# Patient Record
Sex: Female | Born: 1940 | Race: Asian | Hispanic: No | Marital: Married | Smoking: Never smoker
Health system: Southern US, Community
[De-identification: ages and names within clinical notes are randomized; demographics above are authoritative.]

## PROBLEM LIST (undated history)

## (undated) DIAGNOSIS — R0789 Other chest pain: Secondary | ICD-10-CM

## (undated) DIAGNOSIS — I10 Essential (primary) hypertension: Secondary | ICD-10-CM

## (undated) DIAGNOSIS — E785 Hyperlipidemia, unspecified: Secondary | ICD-10-CM

## (undated) DIAGNOSIS — R002 Palpitations: Secondary | ICD-10-CM

## (undated) HISTORY — DX: Palpitations: R00.2

## (undated) HISTORY — DX: Hyperlipidemia, unspecified: E78.5

## (undated) HISTORY — DX: Essential (primary) hypertension: I10

## (undated) HISTORY — PX: OVARY SURGERY: SHX727

## (undated) HISTORY — DX: Other chest pain: R07.89

---

## 1997-12-23 ENCOUNTER — Ambulatory Visit (HOSPITAL_COMMUNITY): Admission: RE | Admit: 1997-12-23 | Discharge: 1997-12-23 | Payer: Self-pay | Admitting: Gynecology

## 1999-02-18 ENCOUNTER — Ambulatory Visit (HOSPITAL_COMMUNITY): Admission: RE | Admit: 1999-02-18 | Discharge: 1999-02-18 | Payer: Self-pay | Admitting: Gynecology

## 1999-02-18 ENCOUNTER — Encounter: Payer: Self-pay | Admitting: Gynecology

## 1999-03-04 ENCOUNTER — Ambulatory Visit (HOSPITAL_COMMUNITY): Admission: RE | Admit: 1999-03-04 | Discharge: 1999-03-04 | Payer: Self-pay | Admitting: Gynecology

## 1999-03-04 ENCOUNTER — Encounter: Payer: Self-pay | Admitting: Gynecology

## 1999-09-14 ENCOUNTER — Other Ambulatory Visit: Admission: RE | Admit: 1999-09-14 | Discharge: 1999-09-14 | Payer: Self-pay | Admitting: Gynecology

## 2001-01-15 ENCOUNTER — Encounter: Payer: Self-pay | Admitting: *Deleted

## 2001-01-15 ENCOUNTER — Encounter: Admission: RE | Admit: 2001-01-15 | Discharge: 2001-01-15 | Payer: Self-pay | Admitting: *Deleted

## 2001-01-31 ENCOUNTER — Encounter (INDEPENDENT_AMBULATORY_CARE_PROVIDER_SITE_OTHER): Payer: Self-pay | Admitting: *Deleted

## 2001-01-31 ENCOUNTER — Ambulatory Visit (HOSPITAL_COMMUNITY): Admission: RE | Admit: 2001-01-31 | Discharge: 2001-01-31 | Payer: Self-pay | Admitting: *Deleted

## 2002-09-16 ENCOUNTER — Other Ambulatory Visit: Admission: RE | Admit: 2002-09-16 | Discharge: 2002-09-16 | Payer: Self-pay | Admitting: Gynecology

## 2004-12-23 ENCOUNTER — Other Ambulatory Visit: Admission: RE | Admit: 2004-12-23 | Discharge: 2004-12-23 | Payer: Self-pay | Admitting: Gynecology

## 2007-11-22 ENCOUNTER — Encounter: Admission: RE | Admit: 2007-11-22 | Discharge: 2007-11-22 | Payer: Self-pay | Admitting: Family Medicine

## 2012-05-22 ENCOUNTER — Other Ambulatory Visit: Payer: Self-pay | Admitting: Family Medicine

## 2012-05-22 DIAGNOSIS — Z1231 Encounter for screening mammogram for malignant neoplasm of breast: Secondary | ICD-10-CM

## 2012-06-21 ENCOUNTER — Ambulatory Visit
Admission: RE | Admit: 2012-06-21 | Discharge: 2012-06-21 | Disposition: A | Discharge status: Home / Self Care | Payer: Medicare Other | Source: Ambulatory Visit | Attending: Family Medicine | Admitting: Family Medicine

## 2012-06-21 DIAGNOSIS — Z1231 Encounter for screening mammogram for malignant neoplasm of breast: Secondary | ICD-10-CM

## 2013-06-17 ENCOUNTER — Other Ambulatory Visit (HOSPITAL_COMMUNITY): Payer: Self-pay | Admitting: Family Medicine

## 2013-06-17 DIAGNOSIS — Z1231 Encounter for screening mammogram for malignant neoplasm of breast: Secondary | ICD-10-CM

## 2013-07-03 ENCOUNTER — Ambulatory Visit (HOSPITAL_COMMUNITY)
Admission: RE | Admit: 2013-07-03 | Discharge: 2013-07-03 | Disposition: A | Discharge status: Home / Self Care | Payer: Medicare Other | Source: Ambulatory Visit | Attending: Family Medicine | Admitting: Family Medicine

## 2013-07-03 DIAGNOSIS — Z1231 Encounter for screening mammogram for malignant neoplasm of breast: Secondary | ICD-10-CM

## 2014-06-11 ENCOUNTER — Other Ambulatory Visit: Payer: Self-pay | Admitting: Family Medicine

## 2014-06-11 DIAGNOSIS — Z1231 Encounter for screening mammogram for malignant neoplasm of breast: Secondary | ICD-10-CM

## 2014-07-09 ENCOUNTER — Ambulatory Visit (INDEPENDENT_AMBULATORY_CARE_PROVIDER_SITE_OTHER): Payer: Commercial Managed Care - HMO

## 2014-07-09 DIAGNOSIS — Z1231 Encounter for screening mammogram for malignant neoplasm of breast: Secondary | ICD-10-CM

## 2014-12-26 DIAGNOSIS — H35361 Drusen (degenerative) of macula, right eye: Secondary | ICD-10-CM | POA: Diagnosis not present

## 2014-12-26 DIAGNOSIS — H3532 Exudative age-related macular degeneration: Secondary | ICD-10-CM | POA: Diagnosis not present

## 2014-12-26 DIAGNOSIS — H35362 Drusen (degenerative) of macula, left eye: Secondary | ICD-10-CM | POA: Diagnosis not present

## 2015-06-09 DIAGNOSIS — L409 Psoriasis, unspecified: Secondary | ICD-10-CM | POA: Diagnosis not present

## 2015-06-09 DIAGNOSIS — E782 Mixed hyperlipidemia: Secondary | ICD-10-CM | POA: Diagnosis not present

## 2015-06-09 DIAGNOSIS — E559 Vitamin D deficiency, unspecified: Secondary | ICD-10-CM | POA: Diagnosis not present

## 2015-06-09 DIAGNOSIS — Z0000 Encounter for general adult medical examination without abnormal findings: Secondary | ICD-10-CM | POA: Diagnosis not present

## 2015-06-09 DIAGNOSIS — I10 Essential (primary) hypertension: Secondary | ICD-10-CM | POA: Diagnosis not present

## 2015-06-09 DIAGNOSIS — Z23 Encounter for immunization: Secondary | ICD-10-CM | POA: Diagnosis not present

## 2015-06-25 DIAGNOSIS — H353222 Exudative age-related macular degeneration, left eye, with inactive choroidal neovascularization: Secondary | ICD-10-CM | POA: Diagnosis not present

## 2015-06-25 DIAGNOSIS — H353132 Nonexudative age-related macular degeneration, bilateral, intermediate dry stage: Secondary | ICD-10-CM | POA: Diagnosis not present

## 2015-09-01 DIAGNOSIS — R05 Cough: Secondary | ICD-10-CM | POA: Diagnosis not present

## 2015-09-01 DIAGNOSIS — R509 Fever, unspecified: Secondary | ICD-10-CM | POA: Diagnosis not present

## 2015-09-22 DIAGNOSIS — H353131 Nonexudative age-related macular degeneration, bilateral, early dry stage: Secondary | ICD-10-CM | POA: Diagnosis not present

## 2015-12-24 DIAGNOSIS — H353222 Exudative age-related macular degeneration, left eye, with inactive choroidal neovascularization: Secondary | ICD-10-CM | POA: Diagnosis not present

## 2015-12-24 DIAGNOSIS — H353132 Nonexudative age-related macular degeneration, bilateral, intermediate dry stage: Secondary | ICD-10-CM | POA: Diagnosis not present

## 2015-12-24 DIAGNOSIS — H35361 Drusen (degenerative) of macula, right eye: Secondary | ICD-10-CM | POA: Diagnosis not present

## 2015-12-24 DIAGNOSIS — H3570 Unspecified separation of retinal layers: Secondary | ICD-10-CM | POA: Diagnosis not present

## 2016-05-18 DIAGNOSIS — K5730 Diverticulosis of large intestine without perforation or abscess without bleeding: Secondary | ICD-10-CM | POA: Diagnosis not present

## 2016-05-18 DIAGNOSIS — Z1211 Encounter for screening for malignant neoplasm of colon: Secondary | ICD-10-CM | POA: Diagnosis not present

## 2016-06-16 DIAGNOSIS — H353132 Nonexudative age-related macular degeneration, bilateral, intermediate dry stage: Secondary | ICD-10-CM | POA: Diagnosis not present

## 2016-06-16 DIAGNOSIS — H35363 Drusen (degenerative) of macula, bilateral: Secondary | ICD-10-CM | POA: Diagnosis not present

## 2016-06-16 DIAGNOSIS — H35361 Drusen (degenerative) of macula, right eye: Secondary | ICD-10-CM | POA: Diagnosis not present

## 2016-06-16 DIAGNOSIS — H35362 Drusen (degenerative) of macula, left eye: Secondary | ICD-10-CM | POA: Diagnosis not present

## 2016-06-16 DIAGNOSIS — H353222 Exudative age-related macular degeneration, left eye, with inactive choroidal neovascularization: Secondary | ICD-10-CM | POA: Diagnosis not present

## 2016-07-11 DIAGNOSIS — I10 Essential (primary) hypertension: Secondary | ICD-10-CM | POA: Diagnosis not present

## 2016-07-11 DIAGNOSIS — H353290 Exudative age-related macular degeneration, unspecified eye, stage unspecified: Secondary | ICD-10-CM | POA: Diagnosis not present

## 2016-07-11 DIAGNOSIS — Z0000 Encounter for general adult medical examination without abnormal findings: Secondary | ICD-10-CM | POA: Diagnosis not present

## 2016-07-11 DIAGNOSIS — E782 Mixed hyperlipidemia: Secondary | ICD-10-CM | POA: Diagnosis not present

## 2016-07-11 DIAGNOSIS — E559 Vitamin D deficiency, unspecified: Secondary | ICD-10-CM | POA: Diagnosis not present

## 2016-08-23 DIAGNOSIS — B029 Zoster without complications: Secondary | ICD-10-CM | POA: Diagnosis not present

## 2017-02-09 DIAGNOSIS — Z23 Encounter for immunization: Secondary | ICD-10-CM | POA: Diagnosis not present

## 2017-02-09 DIAGNOSIS — Z7189 Other specified counseling: Secondary | ICD-10-CM | POA: Diagnosis not present

## 2017-02-23 DIAGNOSIS — H35362 Drusen (degenerative) of macula, left eye: Secondary | ICD-10-CM | POA: Diagnosis not present

## 2017-02-23 DIAGNOSIS — H35361 Drusen (degenerative) of macula, right eye: Secondary | ICD-10-CM | POA: Diagnosis not present

## 2017-02-23 DIAGNOSIS — H35363 Drusen (degenerative) of macula, bilateral: Secondary | ICD-10-CM | POA: Diagnosis not present

## 2017-02-23 DIAGNOSIS — H353222 Exudative age-related macular degeneration, left eye, with inactive choroidal neovascularization: Secondary | ICD-10-CM | POA: Diagnosis not present

## 2017-02-23 DIAGNOSIS — H353132 Nonexudative age-related macular degeneration, bilateral, intermediate dry stage: Secondary | ICD-10-CM | POA: Diagnosis not present

## 2017-08-11 ENCOUNTER — Other Ambulatory Visit: Payer: Self-pay | Admitting: Family Medicine

## 2017-08-11 DIAGNOSIS — Z0000 Encounter for general adult medical examination without abnormal findings: Secondary | ICD-10-CM | POA: Diagnosis not present

## 2017-08-11 DIAGNOSIS — R7301 Impaired fasting glucose: Secondary | ICD-10-CM | POA: Diagnosis not present

## 2017-08-11 DIAGNOSIS — Z1231 Encounter for screening mammogram for malignant neoplasm of breast: Secondary | ICD-10-CM

## 2017-08-11 DIAGNOSIS — E559 Vitamin D deficiency, unspecified: Secondary | ICD-10-CM | POA: Diagnosis not present

## 2017-08-11 DIAGNOSIS — Z23 Encounter for immunization: Secondary | ICD-10-CM | POA: Diagnosis not present

## 2017-08-11 DIAGNOSIS — I10 Essential (primary) hypertension: Secondary | ICD-10-CM | POA: Diagnosis not present

## 2017-08-11 DIAGNOSIS — E782 Mixed hyperlipidemia: Secondary | ICD-10-CM | POA: Diagnosis not present

## 2017-08-17 ENCOUNTER — Ambulatory Visit (INDEPENDENT_AMBULATORY_CARE_PROVIDER_SITE_OTHER): Payer: Medicare HMO

## 2017-08-17 DIAGNOSIS — Z1231 Encounter for screening mammogram for malignant neoplasm of breast: Secondary | ICD-10-CM

## 2017-10-02 DIAGNOSIS — H353132 Nonexudative age-related macular degeneration, bilateral, intermediate dry stage: Secondary | ICD-10-CM | POA: Diagnosis not present

## 2018-03-13 DIAGNOSIS — H43811 Vitreous degeneration, right eye: Secondary | ICD-10-CM | POA: Diagnosis not present

## 2018-03-13 DIAGNOSIS — H353222 Exudative age-related macular degeneration, left eye, with inactive choroidal neovascularization: Secondary | ICD-10-CM | POA: Diagnosis not present

## 2018-03-13 DIAGNOSIS — H353132 Nonexudative age-related macular degeneration, bilateral, intermediate dry stage: Secondary | ICD-10-CM | POA: Diagnosis not present

## 2018-03-13 DIAGNOSIS — H3570 Unspecified separation of retinal layers: Secondary | ICD-10-CM | POA: Diagnosis not present

## 2018-04-04 DIAGNOSIS — R002 Palpitations: Secondary | ICD-10-CM | POA: Diagnosis not present

## 2018-04-05 ENCOUNTER — Ambulatory Visit (INDEPENDENT_AMBULATORY_CARE_PROVIDER_SITE_OTHER): Payer: Medicare HMO

## 2018-04-05 DIAGNOSIS — I480 Paroxysmal atrial fibrillation: Secondary | ICD-10-CM | POA: Diagnosis not present

## 2018-04-05 DIAGNOSIS — R002 Palpitations: Secondary | ICD-10-CM | POA: Diagnosis not present

## 2018-05-21 ENCOUNTER — Other Ambulatory Visit: Payer: Self-pay | Admitting: Cardiology

## 2018-05-21 DIAGNOSIS — I480 Paroxysmal atrial fibrillation: Secondary | ICD-10-CM

## 2018-06-25 ENCOUNTER — Ambulatory Visit: Payer: Medicare Other | Admitting: Interventional Cardiology

## 2018-06-28 ENCOUNTER — Encounter: Payer: Self-pay | Admitting: *Deleted

## 2018-06-28 NOTE — Progress Notes (Signed)
Cardiology Office Note:    Date:  06/29/2018   ID:  Jasmin Young, DOB Oct 17, 1940, MRN [id number]  PCP:  Daisy Floro, MD  Cardiologist:  Norman Herrlich, MD   Referring MD: Clayborn Heron, MD  ASSESSMENT:    1. Palpitation   2. Essential hypertension   3. Hyperlipidemia, unspecified hyperlipidemia type   4. APC (atrial premature contractions)    PLAN:    In order of problems listed above:  1. Has improved she has symptomatic APCs at this time would not start a beta-blocker or antiarrhythmic drug she will undergo echocardiogram to evaluate for structural heart disease check magnesium and potassium are taking a thiazide diuretic to look for correctable causes of arrhythmia.  She is comfortable with a cautious approach 2. Stable continue her thiazide diuretic 3. Stable continue over-the-counter statin 4. Check magnesium potassium echocardiogram for now hold on antiarrhythmic drugs or beta-blocker suppression  Next appointment 6 weeks   Medication Adjustments/Labs and Tests Ordered: Current medicines are reviewed at length with the patient today.  Concerns regarding medicines are outlined above.  No orders of the defined types were placed in this encounter.  No orders of the defined types were placed in this encounter.    Chief Complaint  Patient presents with  . Palpitations    I had an event monitor    History of Present Illness:    Jasmin Young is a 77 y.o. female who is being seen today for the evaluation of palpitation at the request of Rankins, Fanny Dance, MD. She wore an event monitor in September 2019 which showed symptomatic APC's.  September she had episodes of her heart racing associated with the dizziness and momentary chest tightness symptoms were not exertional and clearly associated with palpitation subsequent worn event monitor the symptoms are improved and shows the presence of frequent APCs but no episodes of atrial fibrillation or flutter no  further chest pain or syncope.  She has no known heart disease however EKG implies right ventricular hypertrophy.  No edema shortness of breath or syncope.  For further evaluation of an echocardiogram and will check lab work including MP and magnesium to exclude electrolyte abnormalities precipitating her arrhythmia.  At this time after discussion we will not start a beta-blocker or antiarrhythmic drug Past Medical History:  Diagnosis Date  . Chest pressure   . Hyperlipidemia   . Hypertension   . Palpitations     Past Surgical History:  Procedure Laterality Date  . CESAREAN SECTION    . OVARY SURGERY      Current Medications: Current Meds  Medication Sig  . amLODipine (NORVASC) 5 MG tablet Take 5 mg by mouth daily.  . Ascorbic Acid (VITAMIN C PO) Take 1 tablet by mouth daily.  Marland Kitchen aspirin EC 81 MG tablet Take 81 mg by mouth daily.  Marland Kitchen b complex vitamins capsule Take 1 capsule by mouth daily.  . calcium carbonate (CALCIUM 600) 600 MG TABS tablet Take 600 mg by mouth daily.  . Cholecalciferol (VITAMIN D-3 PO) Take 2,000 Units by mouth daily.  . hydrochlorothiazide (HYDRODIURIL) 25 MG tablet Take 25 mg by mouth daily.  . Multiple Vitamin (MULTIVITAMIN) tablet Take 1 tablet by mouth daily.  . Multiple Vitamins-Minerals (PRESERVISION AREDS 2 PO) Take 2 capsules by mouth daily.  . Red Yeast Rice 600 MG TABS Take 1 tablet by mouth daily.     Allergies:   Ace inhibitors and Penicillins   Social History   Socioeconomic History  .  Marital status: Married    Spouse name: Not on file  . Number of children: Not on file  . Years of education: Not on file  . Highest education level: Not on file  Occupational History  . Not on file  Social Needs  . Financial resource strain: Not on file  . Food insecurity:    Worry: Not on file    Inability: Not on file  . Transportation needs:    Medical: Not on file    Non-medical: Not on file  Tobacco Use  . Smoking status: Never Smoker  .  Smokeless tobacco: Never Used  Substance and Sexual Activity  . Alcohol use: Never    Frequency: Never  . Drug use: Never  . Sexual activity: Not on file  Lifestyle  . Physical activity:    Days per week: Not on file    Minutes per session: Not on file  . Stress: Not on file  Relationships  . Social connections:    Talks on phone: Not on file    Gets together: Not on file    Attends religious service: Not on file    Active member of club or organization: Not on file    Attends meetings of clubs or organizations: Not on file    Relationship status: Not on file  Other Topics Concern  . Not on file  Social History Narrative  . Not on file     Family History: The patient's family history includes Dementia in her mother; Diabetes in her brother.  ROS:   Review of Systems  Constitution: Negative.  HENT: Negative.   Eyes: Negative.   Cardiovascular: Positive for chest pain and palpitations.  Respiratory: Negative.   Endocrine: Negative.   Hematologic/Lymphatic: Negative.   Skin: Negative.   Musculoskeletal: Positive for back pain.  Gastrointestinal: Negative.   Neurological: Positive for dizziness.  Psychiatric/Behavioral: Negative.    Please see the history of present illness.     All other systems reviewed and are negative.  EKGs/Labs/Other Studies Reviewed:    The following studies were reviewed today: I reviewed her event monitor prior to the visit to see if I can arrange for this study to be assigned to me so I can provide an official interpretation frequent symptomatic episodes associated with APCs  EKG:  EKG is  ordered today.  The ekg ordered today demonstrates [facility] consider RVH  Recent Labs: No results found for requested labs within last 8760 hours.  Recent Lipid Panel No results found for: CHOL, TRIG, HDL, CHOLHDL, VLDL, LDLCALC, LDLDIRECT  Physical Exam:    VS:  BP (!) 144/78 (BP Location: Left Arm, Patient Position: Sitting, Cuff Size: Normal)   Pulse  77   Ht 5\' 3"  (1.6 m)   Wt 137 lb 1.9 oz (62.2 kg)   SpO2 98%   BMI 24.29 kg/m     Wt Readings from Last 3 Encounters:  06/29/18 137 lb 1.9 oz (62.2 kg)     GEN:  Well nourished, well developed in no acute distress HEENT: Normal NECK: No JVD; No carotid bruits LYMPHATICS: No lymphadenopathy CARDIAC: RRR, no murmurs, rubs, gallops RESPIRATORY:  Clear to auscultation without rales, wheezing or rhonchi  ABDOMEN: Soft, non-tender, non-distended MUSCULOSKELETAL:  No edema; No deformity  SKIN: Warm and dry NEUROLOGIC:  Alert and oriented x 3 PSYCHIATRIC:  Normal affect     Signed, 07/01/18, MD  06/29/2018 2:38 PM    [facility]

## 2018-06-29 ENCOUNTER — Encounter: Payer: Self-pay | Admitting: Cardiology

## 2018-06-29 ENCOUNTER — Ambulatory Visit (INDEPENDENT_AMBULATORY_CARE_PROVIDER_SITE_OTHER): Payer: Medicare HMO | Admitting: Cardiology

## 2018-06-29 VITALS — BP 144/78 | HR 77 | Ht 63.0 in | Wt 137.1 lb

## 2018-06-29 DIAGNOSIS — I10 Essential (primary) hypertension: Secondary | ICD-10-CM

## 2018-06-29 DIAGNOSIS — E785 Hyperlipidemia, unspecified: Secondary | ICD-10-CM

## 2018-06-29 DIAGNOSIS — I491 Atrial premature depolarization: Secondary | ICD-10-CM

## 2018-06-29 DIAGNOSIS — R002 Palpitations: Secondary | ICD-10-CM | POA: Diagnosis not present

## 2018-06-29 NOTE — Patient Instructions (Addendum)
Medication Instructions:  Your physician recommends that you continue on your current medications as directed. Please refer to the Current Medication list given to you today.  If you need a refill on your cardiac medications before your next appointment, please call your pharmacy.   Lab work: Your physician recommends that you return for lab work today: BMP, magnesium.   If you have labs (blood work) drawn today and your tests are completely normal, you will receive your results only by: Marland Kitchen MyChart Message (if you have MyChart) OR . A paper copy in the mail If you have any lab test that is abnormal or we need to change your treatment, we will call you to review the results.  Testing/Procedures: You had an EKG today.   Your physician has requested that you have an echocardiogram. Echocardiography is a painless test that uses sound waves to create images of your heart. It provides your doctor with information about the size and shape of your heart and how well your heart's chambers and valves are working. This procedure takes approximately one hour. There are no restrictions for this procedure.  Follow-Up: At [facility], you and your health needs are our priority.  As part of our continuing mission to provide you with exceptional heart care, we have created designated Provider Care Teams.  These Care Teams include your primary Cardiologist (physician) and Advanced Practice Providers (APPs -  Physician Assistants and Nurse Practitioners) who all work together to provide you with the care you need, when you need it. You will need a follow up appointment in 6 weeks.    1. Avoid all over-the-counter antihistamines except Claritin/Loratadine and Zyrtec/Cetrizine. 2. Avoid all combination including cold sinus allergies flu decongestant and sleep medications 3. You can use Robitussin DM Mucinex and Mucinex DM for cough. 4. can use Tylenol aspirin ibuprofen and naproxen but no combinations such as  sleep or sinus.   Echocardiogram An echocardiogram, or echocardiography, uses sound waves (ultrasound) to produce an image of your heart. The echocardiogram is simple, painless, obtained within a short period of time, and offers valuable information to your health care provider. The images from an echocardiogram can provide information such as:  Evidence of coronary artery disease (CAD).  Heart size.  Heart muscle function.  Heart valve function.  Aneurysm detection.  Evidence of a past heart attack.  Fluid buildup around the heart.  Heart muscle thickening.  Assess heart valve function.  Tell a health care provider about:  Any allergies you have.  All medicines you are taking, including vitamins, herbs, eye drops, creams, and over-the-counter medicines.  Any problems you or family members have had with anesthetic medicines.  Any blood disorders you have.  Any surgeries you have had.  Any medical conditions you have.  Whether you are pregnant or may be pregnant. What happens before the procedure? No special preparation is needed. Eat and drink normally. What happens during the procedure?  In order to produce an image of your heart, gel will be applied to your chest and a wand-like tool (transducer) will be moved over your chest. The gel will help transmit the sound waves from the transducer. The sound waves will harmlessly bounce off your heart to allow the heart images to be captured in real-time motion. These images will then be recorded.  You may need an IV to receive a medicine that improves the quality of the pictures. What happens after the procedure? You may return to your normal schedule including diet,  activities, and medicines, unless your health care provider tells you otherwise. This information is not intended to replace advice given to you by your health care provider. Make sure you discuss any questions you have with your health care provider. Document  Released: 07/22/2000 Document Revised: 03/12/2016 Document Reviewed: 04/01/2013 Elsevier Interactive Patient Education  2017 [employer].

## 2018-06-30 LAB — BASIC METABOLIC PANEL
BUN/Creatinine Ratio: 26 (ref 12–28)
BUN: 17 mg/dL (ref 8–27)
CALCIUM: 9.6 mg/dL (ref 8.7–10.3)
CHLORIDE: 97 mmol/L (ref 96–106)
CO2: 25 mmol/L (ref 20–29)
Creatinine, Ser: 0.65 mg/dL (ref 0.57–1.00)
GFR calc Af Amer: 99 mL/min/{1.73_m2} (ref >59)
GFR calc non Af Amer: 86 mL/min/{1.73_m2} (ref >59)
GLUCOSE: 112 mg/dL — AB (ref 65–99)
POTASSIUM: 3.5 mmol/L (ref 3.5–5.2)
Sodium: 139 mmol/L (ref 134–144)

## 2018-06-30 LAB — MAGNESIUM: Magnesium: 2.2 mg/dL (ref 1.6–2.3)

## 2018-07-02 ENCOUNTER — Telehealth: Payer: Self-pay | Admitting: *Deleted

## 2018-07-02 MED ORDER — POTASSIUM CHLORIDE CRYS ER 20 MEQ PO TBCR: 20.0000 meq | EXTENDED_RELEASE_TABLET | Freq: Every day | ORAL | 3 refills | Status: DC

## 2018-07-02 NOTE — Telephone Encounter (Signed)
-----   Message from Baldo Daub, MD sent at 07/01/2018  9:36 AM EST ----- Potassium is borderline low can cause palpitations start 20 meq daily #30 refill 3

## 2018-07-02 NOTE — Telephone Encounter (Signed)
Patient informed of lab results and advised to start potassium 20 mEq daily. Patient verbalized understanding, [facility] as requested. No further questions.

## 2018-07-06 ENCOUNTER — Ambulatory Visit (HOSPITAL_BASED_OUTPATIENT_CLINIC_OR_DEPARTMENT_OTHER)
Admission: RE | Admit: 2018-07-06 | Discharge: 2018-07-06 | Disposition: A | Discharge status: Home / Self Care | Payer: Medicare HMO | Source: Ambulatory Visit | Attending: Cardiology | Admitting: Cardiology

## 2018-07-06 DIAGNOSIS — R002 Palpitations: Secondary | ICD-10-CM

## 2018-07-06 DIAGNOSIS — I491 Atrial premature depolarization: Secondary | ICD-10-CM | POA: Diagnosis not present

## 2018-07-06 DIAGNOSIS — I10 Essential (primary) hypertension: Secondary | ICD-10-CM | POA: Diagnosis not present

## 2018-07-06 NOTE — Progress Notes (Signed)
  Echocardiogram 2D Echocardiogram has been performed.  Jasmin Young T [name] 07/06/2018, 3:34 PM

## 2018-08-09 NOTE — Progress Notes (Signed)
Cardiology Office Note:    Date:  08/10/2018   ID:  Jasmin Young, DOB 08/15/1940, MRN [id number]  PCP:  Jasmin Young, Jasmin Alan, MD  Cardiologist:  Jasmin Young , MD    Referring MD: Jasmin Young, Jasmin Alan, MD    ASSESSMENT:    1. APC (atrial premature contractions)   2. Hypokalemia   3. Essential hypertension    PLAN:    In order of problems listed above:  1. Remains symptomatic systolic blood pressure elevated will put her on a beta-blocker by systolic which should be effective in achieving target heart rate and alleviating symptoms with atrial arrhythmia. 2. Continue potassium supplement her K was low normal with a normal magnesium recently 3. Not at target despite a multidrug regimen had diastolic   Next appointment: 6 months   Medication Adjustments/Labs and Tests Ordered: Current medicines are reviewed at length with the patient today.  Concerns regarding medicines are outlined above.  No orders of the defined types were placed in this encounter.  Meds ordered this encounter  Medications  . nebivolol (BYSTOLIC) 5 MG tablet    Sig: Take 1 tablet (5 mg total) by mouth daily.    Dispense:  30 tablet    Refill:  3    Chief Complaint  Patient presents with  . Palpitations    with APC's    History of Present Illness:    Jasmin Young is a 78 y.o. female with a hx of symptomatic APCs with palpitation along with hypertension and hyperlipidemia last seen 06/29/2018. Compliance with diet, lifestyle and medications: Yes  Despite adding potassium and reassuring echocardiogram she continues to have brief episodes of rapid heart rhythm causing apprehension and lightheadedness.  Her systolic blood pressure is elevated after discussion we decided together to initiate a beta-blocker.  No chest pain syncope or shortness of breath.  Echo 07/06/18: Study Conclusions - Left ventricle: The cavity size was normal. Wall thickness was  increased in a pattern of moderate LVH. Systolic  function was   normal. The estimated ejection fraction was in the range of 55%  to 60%. Wall motion was normal; there were no regional  wall  motion abnormalities. 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 Past Medical History:  Diagnosis Date  . Chest pressure   . Hyperlipidemia   . Hypertension   . Palpitations     Past Surgical History:  Procedure Laterality Date  . CESAREAN SECTION    . OVARY SURGERY      Current Medications: Current Meds  Medication Sig  . amLODipine (NORVASC) 5 MG tablet Take 5 mg by mouth daily.  . Ascorbic Acid (VITAMIN C PO) Take 1 tablet by mouth daily.  Marland Kitchen. aspirin EC 81 MG tablet Take 81 mg by mouth daily.  Marland Kitchen. b complex vitamins capsule Take 1 capsule by mouth daily.  . calcium carbonate (CALCIUM 600) 600 MG TABS tablet Take 600 mg by mouth daily.  . Cholecalciferol (VITAMIN D-3 PO) Take 2,000 Units by mouth daily.  . hydrochlorothiazide (HYDRODIURIL) 25 MG tablet Take 25 mg by mouth daily.  . Multiple Vitamin (MULTIVITAMIN) tablet Take 1 tablet by mouth daily.  . Multiple Vitamins-Minerals (PRESERVISION AREDS 2 PO) Take 2 capsules by mouth daily.  . potassium chloride SA (KLOR-CON M20) 20 MEQ tablet Take 1 tablet (20 mEq total) by mouth daily.  . Red Yeast Rice 600 MG TABS Take 1 tablet by mouth daily.     Allergies:   Ace  inhibitors and Penicillins   Social History   Socioeconomic History  . Marital status: Married    Spouse name: Not on file  . Number of children: Not on file  . Years of education: Not on file  . Highest education level: Not on file  Occupational History  . Not on file  Social Needs  . Financial resource strain: Not on file  . Food insecurity:    Worry: Not on file    Inability: Not on file  . Transportation needs:    Medical: Not on file    Non-medical: Not on file  Tobacco Use  . Smoking status: Never Smoker  . Smokeless tobacco: Never Used  Substance and  Sexual Activity  . Alcohol use: Never    Frequency: Never  . Drug use: Never  . Sexual activity: Not on file  Lifestyle  . Physical activity:    Days per week: Not on file    Minutes per session: Not on file  . Stress: Not on file  Relationships  . Social connections:    Talks on phone: Not on file    Gets together: Not on file    Attends religious service: Not on file    Active member of club or organization: Not on file    Attends meetings of clubs or organizations: Not on file    Relationship status: Not on file  Other Topics Concern  . Not on file  Social History Narrative  . Not on file     Family History: The patient's family history includes Dementia in her mother; Diabetes in her brother. ROS:   Please see the history of present illness.    All other systems reviewed and are negative.  EKGs/Labs/Other Studies Reviewed:    The following studies were reviewed today:    Recent Labs: 06/29/2018: BUN 17; Creatinine, Ser 0.65; Magnesium 2.2; Potassium 3.5; Sodium 139  Recent Lipid Panel No results found for: CHOL, TRIG, HDL, CHOLHDL, VLDL, LDLCALC, LDLDIRECT  Physical Exam:    VS:  BP (!) 160/70   Pulse 90   Ht 5\' 3"  (1.6 m)   Wt 137 lb 1.9 oz (62.2 kg)   SpO2 95%   BMI 24.29 kg/m     Wt Readings from Last 3 Encounters:  08/10/18 137 lb 1.9 oz (62.2 kg)  06/29/18 137 lb 1.9 oz (62.2 kg)     GEN:  Well nourished, well developed in no acute distress HEENT: Normal NECK: No JVD; No carotid bruits LYMPHATICS: No lymphadenopathy CARDIAC: RRR, no murmurs, rubs, gallops RESPIRATORY:  Clear to auscultation without rales, wheezing or rhonchi  ABDOMEN: Soft, non-tender, non-distended MUSCULOSKELETAL:  No edema; No deformity  SKIN: Warm and dry NEUROLOGIC:  Alert and oriented x 3 PSYCHIATRIC:  Normal affect    Signed, Jasmin Herrlich, MD  08/10/2018 1:03 PM    [facility]

## 2018-08-10 ENCOUNTER — Encounter: Payer: Self-pay | Admitting: Cardiology

## 2018-08-10 ENCOUNTER — Ambulatory Visit (INDEPENDENT_AMBULATORY_CARE_PROVIDER_SITE_OTHER): Payer: Medicare HMO | Admitting: Cardiology

## 2018-08-10 VITALS — BP 160/70 | HR 90 | Ht 63.0 in | Wt 137.1 lb

## 2018-08-10 DIAGNOSIS — I491 Atrial premature depolarization: Secondary | ICD-10-CM

## 2018-08-10 DIAGNOSIS — E876 Hypokalemia: Secondary | ICD-10-CM | POA: Diagnosis not present

## 2018-08-10 DIAGNOSIS — I10 Essential (primary) hypertension: Secondary | ICD-10-CM

## 2018-08-10 MED ORDER — NEBIVOLOL HCL 5 MG PO TABS: 5.0000 mg | ORAL_TABLET | Freq: Every day | ORAL | 3 refills | Status: DC

## 2018-08-10 NOTE — Patient Instructions (Addendum)
Medication Instructions:  Your physician has recommended you make the following change in your medication:   START: bystolic 5mg  one tablet daily  If you need a refill on your cardiac medications before your next appointment, please call your pharmacy.   Lab work: NONE If you have labs (blood work) drawn today and your tests are completely normal, you will receive your results only by: Marland Kitchen MyChart Message (if you have MyChart) OR . A paper copy in the mail If you have any lab test that is abnormal or we need to change your treatment, we will call you to review the results.  Testing/Procedures: NONE  Follow-Up: At [facility], you and your health needs are our priority.  As part of our continuing mission to provide you with exceptional heart care, we have created designated Provider Care Teams.  These Care Teams include your primary Cardiologist (physician) and Advanced Practice Providers (APPs -  Physician Assistants and Nurse Practitioners) who all work together to provide you with the care you need, when you need it. You will need a follow up appointment in 3 months.  Please call our office 2 months in advance to schedule this appointment.     Nebivolol oral tablets What is this medicine? NEBIVOLOL (ne BIV oh lol) is a beta-blocker. Beta-blockers reduce the workload on the heart and help it to beat more regularly. This medicine is used to treat high blood pressure. This medicine may be used for other purposes; ask your health care provider or pharmacist if you have questions. COMMON BRAND NAME(S): Bystolic What should I tell my health care provider before I take this medicine? They need to know if you have any of these conditions: -diabetes -heart or vessel disease like slow heartrate, worsening heart failure, heart block, sick sinus syndrome or Raynaud's disease -kidney disease -liver disease -lung disease like asthma or emphysema -pheochromocytoma -thyroid disease -an unusual  or allergic reaction to nebivolol, other beta-blockers, medicines, foods, dyes, or preservatives -pregnant or trying to get pregnant -breast-feeding How should I use this medicine? Take this medicine by mouth with a glass of water. Follow the directions on the prescription label. You can take this medicine with or without food. Take your doses at regular intervals. Do not take your medicine more often than directed. Do not stop taking this medicine suddenly. This could lead to serious heart-related effects. Talk to your pediatrician regarding the use of this medicine in children. Special care may be needed. Overdosage: If you think you have taken too much of this medicine contact a poison control center or emergency room at once. NOTE: This medicine is only for you. Do not share this medicine with others. What if I miss a dose? If you miss a dose, take it as soon as you can. If it is almost time for your next dose, take only that dose. Do not take double or extra doses. What may interact with this medicine? This medicine may interact with the following medications: -certain medicines for blood pressure, heart disease, irregular heart beat -certain medicines for depression, like fluoxetine or paroxetine -cimetidine -clonidine -reserpine -sildenafil This list may not describe all possible interactions. Give your health care provider a list of all the medicines, herbs, non-prescription drugs, or dietary supplements you use. Also tell them if you smoke, drink alcohol, or use illegal drugs. Some items may interact with your medicine. What should I watch for while using this medicine? Visit your doctor or health care professional for regular checks on  your progress. Check your heart rate and blood pressure regularly while you are taking this medicine. Ask your doctor or health care professional what your heart rate and blood pressure should be, and when you should contact him or her. You may get  drowsy or dizzy. Do not drive, use machinery, or do anything that needs mental alertness until you know how this drug affects you. Do not stand or sit up quickly, especially if you are an older patient. This reduces the risk of dizzy or fainting spells. Alcohol can make you more drowsy and dizzy. Avoid alcoholic drinks. This medicine can affect blood sugar levels. If you have diabetes, check with your doctor or health care professional before you change your diet or the dose of your diabetic medicine. Do not treat yourself for coughs, colds, or pain while you are taking this medicine without asking your doctor or health care professional for advice. Some ingredients may increase your blood pressure. What side effects may I notice from receiving this medicine? Side effects that you should report to your doctor or health care professional as soon as possible: -allergic reactions like skin rash, itching or hives, swelling of the face, lips, or tongue -breathing problems -chest pain -cold, tingling, or numb hands or feet -dark urine -general ill feeling or flu-like symptoms -irregular heartbeat -light-colored stools -loss of appetite, nausea -right upper belly pain -slow heart rate -swollen legs or ankles -unusual bleeding or bruising -unusually weak or tired -vomiting -yellowing of the eyes or skin Side effects that usually do not require medical attention (report to your doctor or health care professional if they continue or are bothersome): -diarrhea -dizziness -dry or burning eyes -headache -nausea -tiredness -trouble sleeping This list may not describe all possible side effects. Call your doctor for medical advice about side effects. You may report side effects to FDA at 1-800-FDA-1088. Where should I keep my medicine? Keep out of the reach of children. Store at room temperature between 20 and 25 degrees C (68 and 77 degrees F). Protect from light. Keep container tightly closed.  Throw away any unused medicine after the expiration date. NOTE: This sheet is a summary. It may not cover all possible information. If you have questions about this medicine, talk to your doctor, pharmacist, or health care provider.  2019 Elsevier/Gold Standard (2013-03-29 14:46:00)   1. Avoid all over-the-counter antihistamines except Claritin/Loratadine and Zyrtec/Cetrizine. 2. Avoid all combination including cold sinus allergies flu decongestant and sleep medications 3. You can use Robitussin DM Mucinex and Mucinex DM for cough. 4. can use Tylenol aspirin ibuprofen and naproxen but no combinations such as sleep or sinus.

## 2018-09-10 DIAGNOSIS — E559 Vitamin D deficiency, unspecified: Secondary | ICD-10-CM | POA: Diagnosis not present

## 2018-09-10 DIAGNOSIS — E782 Mixed hyperlipidemia: Secondary | ICD-10-CM | POA: Diagnosis not present

## 2018-09-10 DIAGNOSIS — I10 Essential (primary) hypertension: Secondary | ICD-10-CM | POA: Diagnosis not present

## 2018-09-17 DIAGNOSIS — I10 Essential (primary) hypertension: Secondary | ICD-10-CM | POA: Diagnosis not present

## 2018-09-17 DIAGNOSIS — Z0000 Encounter for general adult medical examination without abnormal findings: Secondary | ICD-10-CM | POA: Diagnosis not present

## 2018-09-17 DIAGNOSIS — E782 Mixed hyperlipidemia: Secondary | ICD-10-CM | POA: Diagnosis not present

## 2018-09-17 DIAGNOSIS — E559 Vitamin D deficiency, unspecified: Secondary | ICD-10-CM | POA: Diagnosis not present

## 2018-09-26 ENCOUNTER — Other Ambulatory Visit: Payer: Self-pay | Admitting: Emergency Medicine

## 2018-09-26 MED ORDER — POTASSIUM CHLORIDE CRYS ER 20 MEQ PO TBCR: 20.0000 meq | EXTENDED_RELEASE_TABLET | Freq: Every day | ORAL | 3 refills | Status: DC

## 2018-11-15 ENCOUNTER — Ambulatory Visit: Payer: Medicare HMO | Admitting: Cardiology

## 2018-11-25 ENCOUNTER — Other Ambulatory Visit: Payer: Self-pay | Admitting: Cardiology

## 2018-11-26 NOTE — Telephone Encounter (Signed)
[facility] [address]

## 2018-12-20 ENCOUNTER — Other Ambulatory Visit: Payer: Self-pay | Admitting: Cardiology

## 2019-01-14 NOTE — Progress Notes (Signed)
Virtual Visit via Video Note   This visit type was conducted due to national recommendations for restrictions regarding the COVID-19 Pandemic (e.g. social distancing) in an effort to limit this patient's exposure and mitigate transmission in our community.  Due to her co-morbid illnesses, this patient is at least at moderate risk for complications without adequate follow up.  This format is felt to be most appropriate for this patient at this time.  All issues noted in this document were discussed and addressed.  A limited physical exam was performed with this format.  Please refer to the patient's chart for her consent to telehealth for [facility].   Date:  01/14/2019   ID:  Jasmin Young, DOB 1941-02-12, MRN [id number]  Patient Location: Home Provider Location: Office  PCP:  Daisy Floro, MD  Cardiologist:  Norman Herrlich, MD  Electrophysiologist:  None   Evaluation Performed:  Follow-Up Visit  Chief Complaint:  78 yo F presents for 6 month follow up of PACs.  History of Present Illness:    Jasmin Young is a 78 y.o. female with PMH symptomatic PACs, palpitations, HTN, HLD last seen 08/10/18 at which time she was started on Bystolic.   06/2018 labs: K3.5 creatinine 0.65 GFR 86 Mag 2.2 Echo 06/2018 EF 55-65%, mild LVH, grade 1 diastolic dysfunction.   She reports she has been feeling well and has no concerns regarding her heart. BP today was 158/76 - She took her medication about 7:30am, went walking came home around 10am, and then checked her blood pressure prior to our visit.  Reports no palpitations or fluttering in her heart. Denies chest pain. Walks in the park 4 days per week with no SOB or DOE. No edema.   The patient does not have symptoms concerning for COVID-19 infection (fever, chills, cough, or new shortness of breath).    Past Medical History:  Diagnosis Date  . Chest pressure   . Hyperlipidemia   . Hypertension   . Palpitations    Past Surgical History:   Procedure Laterality Date  . CESAREAN SECTION    . OVARY SURGERY       No outpatient medications have been marked as taking for the 01/15/19 encounter (Appointment) with Baldo Daub, MD.     Allergies:   Ace inhibitors and Penicillins   Social History   Tobacco Use  . Smoking status: Never Smoker  . Smokeless tobacco: Never Used  Substance Use Topics  . Alcohol use: Never    Frequency: Never  . Drug use: Never     Family Hx: The patient's family history includes Dementia in her mother; Diabetes in her brother.  ROS:   Please see the history of present illness.    Review of Systems  Constitution: Negative for chills, diaphoresis and fever.  Cardiovascular: Negative for chest pain, cyanosis, dyspnea on exertion, irregular heartbeat, leg swelling, palpitations and syncope.  Respiratory: Negative for cough, shortness of breath and wheezing.     All other systems reviewed and are negative.   Prior CV studies:   The following studies were reviewed today:  Echo 07/06/18: Study Conclusions - Left ventricle: The cavity size was normal. Wall thickness was  increased in a pattern of moderate LVH. Systolic function was   normal. The estimated ejection fraction was in the range of 55%  to 60%. Wall motion was normal; there were no regional  wall  motion abnormalities. Doppler parameters are consistent with abnormal left ventricular relaxation (grade 1  dysfunction).   Labs/Other Tests and Data Reviewed:    EKG:  No ECG reviewed.  Recent Labs: 06/29/2018: BUN 17; Creatinine, Ser 0.65; Magnesium 2.2; Potassium 3.5; Sodium 139   Recent Lipid Panel No results found for: CHOL, TRIG, HDL, CHOLHDL, LDLCALC, LDLDIRECT  Wt Readings from Last 3 Encounters:  08/10/18 137 lb 1.9 oz (62.2 kg)  06/29/18 137 lb 1.9 oz (62.2 kg)     Objective:    Vital Signs:  There were no vitals taken for this visit.   VITAL SIGNS:  reviewed GEN:  no acute distress RESPIRATORY:  normal  respiratory effort, symmetric expansion NEURO:  alert and oriented x 3, no obvious focal deficit PSYCH:  normal affect  ASSESSMENT & PLAN:    1. HTN - Followed by PCP. BP elevated today with SBP 158. Asked her to check her BP daily for one week. Continue current anti-hypertensives and follow with PCP as I am hesitant to make changes to antihypertensive regimen based on an isolated BP reading. She is walking 4 times per week, encouraged her to continue.  2. APCs - Denies dizziness, lightheadedness, palpitations. Continue Bystolic.  3. HLD - Followed by PCP. 09/10/18 labs: Total cholesterol 202 HDL 44 LDL 110 Triglycerides 239. Recommend low fat, low salt, heart healthy diet.  4. Hypokalemia - Followed by PCP. K4.6 on 09/10/18. Continue potassium supplement.   COVID-19 Education: The signs and symptoms of COVID-19 were discussed with the patient and how to seek care for testing (follow up with PCP or arrange E-visit).  The importance of social distancing was discussed today.  Time:   Today, I have spent 20 minutes with the patient with telehealth technology discussing the above problems.  An additional 7 minutes was spent reviewing labs from her PCP office.    Medication Adjustments/Labs and Tests Ordered: Current medicines are reviewed at length with the patient today.  Concerns regarding medicines are outlined above.   Tests Ordered: No orders of the defined types were placed in this encounter.   Medication Changes: No orders of the defined types were placed in this encounter.   Disposition:  Follow up in 1 year(s)  Signed, Shirlee More, MD  01/14/2019 1:01 PM    [facility]

## 2019-01-15 ENCOUNTER — Other Ambulatory Visit: Payer: Self-pay

## 2019-01-15 ENCOUNTER — Telehealth (INDEPENDENT_AMBULATORY_CARE_PROVIDER_SITE_OTHER): Payer: Medicare HMO | Admitting: Cardiology

## 2019-01-15 ENCOUNTER — Encounter: Payer: Self-pay | Admitting: Cardiology

## 2019-01-15 VITALS — BP 158/76 | HR 66 | Wt 132.0 lb

## 2019-01-15 DIAGNOSIS — I491 Atrial premature depolarization: Secondary | ICD-10-CM | POA: Diagnosis not present

## 2019-01-15 DIAGNOSIS — I10 Essential (primary) hypertension: Secondary | ICD-10-CM | POA: Diagnosis not present

## 2019-01-15 DIAGNOSIS — E876 Hypokalemia: Secondary | ICD-10-CM | POA: Diagnosis not present

## 2019-01-15 DIAGNOSIS — E785 Hyperlipidemia, unspecified: Secondary | ICD-10-CM

## 2019-01-15 MED ORDER — NEBIVOLOL HCL 5 MG PO TABS: 5.0000 mg | ORAL_TABLET | Freq: Every day | ORAL | 4 refills | Status: DC

## 2019-01-15 MED ORDER — POTASSIUM CHLORIDE CRYS ER 20 MEQ PO TBCR: 20.0000 meq | EXTENDED_RELEASE_TABLET | Freq: Every day | ORAL | 4 refills | Status: DC

## 2019-01-15 NOTE — Patient Instructions (Signed)
Medication Instructions:  Your physician recommends that you continue on your current medications as directed. Please refer to the Current Medication list given to you today.  If you need a refill on your cardiac medications before your next appointment, please call your pharmacy.   Lab work: none If you have labs (blood work) drawn today and your tests are completely normal, you will receive your results only by: Marland Kitchen MyChart Message (if you have MyChart) OR . A paper copy in the mail If you have any lab test that is abnormal or we need to change your treatment, we will call you to review the results.  Testing/Procedures: None  Follow-Up: At [facility], you and your health needs are our priority.  As part of our continuing mission to provide you with exceptional heart care, we have created designated Provider Care Teams.  These Care Teams include your primary Cardiologist (physician) and Advanced Practice Providers (APPs -  Physician Assistants and Nurse Practitioners) who all work together to provide you with the care you need, when you need it. You will need a follow up appointment in 1 years.  Please call our office 2 months in advance to schedule this appointment.  You may see Shirlee More, MD or another member of our [place] Provider Team in [place]: Jenne Campus, MD . Jyl Heinz, MD  Any Other Special Instructions Will Be Listed Below (If Applicable).

## 2019-01-23 ENCOUNTER — Other Ambulatory Visit: Payer: Self-pay | Admitting: *Deleted

## 2019-01-23 MED ORDER — NEBIVOLOL HCL 5 MG PO TABS: 5.0000 mg | ORAL_TABLET | Freq: Every day | ORAL | 3 refills | Status: DC

## 2019-01-23 MED ORDER — POTASSIUM CHLORIDE CRYS ER 20 MEQ PO TBCR: 20.0000 meq | EXTENDED_RELEASE_TABLET | Freq: Every day | ORAL | 3 refills | Status: AC

## 2019-01-23 NOTE — Telephone Encounter (Signed)
Refills for bystolic and potassium chloride sent to [facility] as requested.

## 2019-02-21 ENCOUNTER — Other Ambulatory Visit: Payer: Self-pay | Admitting: Cardiology

## 2019-03-14 DIAGNOSIS — H43813 Vitreous degeneration, bilateral: Secondary | ICD-10-CM | POA: Diagnosis not present

## 2019-03-14 DIAGNOSIS — H353221 Exudative age-related macular degeneration, left eye, with active choroidal neovascularization: Secondary | ICD-10-CM | POA: Diagnosis not present

## 2019-03-14 DIAGNOSIS — H353132 Nonexudative age-related macular degeneration, bilateral, intermediate dry stage: Secondary | ICD-10-CM | POA: Diagnosis not present

## 2019-04-18 DIAGNOSIS — H353221 Exudative age-related macular degeneration, left eye, with active choroidal neovascularization: Secondary | ICD-10-CM | POA: Diagnosis not present

## 2019-04-18 DIAGNOSIS — H353222 Exudative age-related macular degeneration, left eye, with inactive choroidal neovascularization: Secondary | ICD-10-CM | POA: Diagnosis not present

## 2019-04-18 DIAGNOSIS — H353132 Nonexudative age-related macular degeneration, bilateral, intermediate dry stage: Secondary | ICD-10-CM | POA: Diagnosis not present

## 2019-04-18 DIAGNOSIS — H43811 Vitreous degeneration, right eye: Secondary | ICD-10-CM | POA: Diagnosis not present

## 2019-05-30 DIAGNOSIS — H43812 Vitreous degeneration, left eye: Secondary | ICD-10-CM | POA: Diagnosis not present

## 2019-05-30 DIAGNOSIS — H353221 Exudative age-related macular degeneration, left eye, with active choroidal neovascularization: Secondary | ICD-10-CM | POA: Diagnosis not present

## 2019-07-11 DIAGNOSIS — H353221 Exudative age-related macular degeneration, left eye, with active choroidal neovascularization: Secondary | ICD-10-CM | POA: Diagnosis not present

## 2019-07-11 DIAGNOSIS — H353132 Nonexudative age-related macular degeneration, bilateral, intermediate dry stage: Secondary | ICD-10-CM | POA: Diagnosis not present

## 2019-07-11 DIAGNOSIS — H353222 Exudative age-related macular degeneration, left eye, with inactive choroidal neovascularization: Secondary | ICD-10-CM | POA: Diagnosis not present

## 2019-07-17 DIAGNOSIS — I10 Essential (primary) hypertension: Secondary | ICD-10-CM | POA: Diagnosis not present

## 2019-07-17 DIAGNOSIS — E782 Mixed hyperlipidemia: Secondary | ICD-10-CM | POA: Diagnosis not present

## 2019-08-27 DIAGNOSIS — I10 Essential (primary) hypertension: Secondary | ICD-10-CM | POA: Diagnosis not present

## 2019-08-27 DIAGNOSIS — E782 Mixed hyperlipidemia: Secondary | ICD-10-CM | POA: Diagnosis not present

## 2019-09-19 DIAGNOSIS — H353221 Exudative age-related macular degeneration, left eye, with active choroidal neovascularization: Secondary | ICD-10-CM | POA: Diagnosis not present

## 2019-09-19 DIAGNOSIS — H353132 Nonexudative age-related macular degeneration, bilateral, intermediate dry stage: Secondary | ICD-10-CM | POA: Diagnosis not present

## 2019-09-19 DIAGNOSIS — M069 Rheumatoid arthritis, unspecified: Secondary | ICD-10-CM | POA: Diagnosis not present

## 2019-09-19 DIAGNOSIS — H43812 Vitreous degeneration, left eye: Secondary | ICD-10-CM | POA: Diagnosis not present

## 2019-09-30 DIAGNOSIS — I10 Essential (primary) hypertension: Secondary | ICD-10-CM | POA: Diagnosis not present

## 2019-09-30 DIAGNOSIS — E782 Mixed hyperlipidemia: Secondary | ICD-10-CM | POA: Diagnosis not present

## 2019-09-30 DIAGNOSIS — Z13220 Encounter for screening for lipoid disorders: Secondary | ICD-10-CM | POA: Diagnosis not present

## 2019-09-30 DIAGNOSIS — Z0000 Encounter for general adult medical examination without abnormal findings: Secondary | ICD-10-CM | POA: Diagnosis not present

## 2019-09-30 DIAGNOSIS — R7301 Impaired fasting glucose: Secondary | ICD-10-CM | POA: Diagnosis not present

## 2019-09-30 DIAGNOSIS — E559 Vitamin D deficiency, unspecified: Secondary | ICD-10-CM | POA: Diagnosis not present

## 2019-10-14 DIAGNOSIS — I10 Essential (primary) hypertension: Secondary | ICD-10-CM | POA: Diagnosis not present

## 2019-10-14 DIAGNOSIS — E782 Mixed hyperlipidemia: Secondary | ICD-10-CM | POA: Diagnosis not present

## 2019-12-19 ENCOUNTER — Encounter (INDEPENDENT_AMBULATORY_CARE_PROVIDER_SITE_OTHER): Payer: Self-pay | Admitting: Ophthalmology

## 2019-12-19 ENCOUNTER — Other Ambulatory Visit: Payer: Self-pay

## 2019-12-19 ENCOUNTER — Ambulatory Visit (INDEPENDENT_AMBULATORY_CARE_PROVIDER_SITE_OTHER): Payer: Medicare HMO | Admitting: Ophthalmology

## 2019-12-19 DIAGNOSIS — H35362 Drusen (degenerative) of macula, left eye: Secondary | ICD-10-CM | POA: Diagnosis not present

## 2019-12-19 DIAGNOSIS — H353124 Nonexudative age-related macular degeneration, left eye, advanced atrophic with subfoveal involvement: Secondary | ICD-10-CM

## 2019-12-19 DIAGNOSIS — H353221 Exudative age-related macular degeneration, left eye, with active choroidal neovascularization: Secondary | ICD-10-CM | POA: Diagnosis not present

## 2019-12-19 DIAGNOSIS — H35361 Drusen (degenerative) of macula, right eye: Secondary | ICD-10-CM | POA: Diagnosis not present

## 2019-12-19 DIAGNOSIS — H353132 Nonexudative age-related macular degeneration, bilateral, intermediate dry stage: Secondary | ICD-10-CM

## 2019-12-19 DIAGNOSIS — H353222 Exudative age-related macular degeneration, left eye, with inactive choroidal neovascularization: Secondary | ICD-10-CM | POA: Diagnosis not present

## 2019-12-19 HISTORY — DX: Exudative age-related macular degeneration, left eye, with active choroidal neovascularization: H35.3221

## 2019-12-19 MED ORDER — BEVACIZUMAB CHEMO INJECTION 1.25MG/0.05ML SYRINGE FOR KALEIDOSCOPE
1.2500 mg | INTRAVITREAL | Status: AC | PRN
  Administered 2019-12-19: 1.25 mg via INTRAVITREAL

## 2019-12-19 NOTE — Progress Notes (Signed)
12/19/2019     CHIEF COMPLAINT Patient presents for Retina Follow Up   HISTORY OF PRESENT ILLNESS: Jasmin Young is a 79 y.o. 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  Severity is moderate.  Duration of 3 months.  Since onset it is stable.  I, the attending physician,  performed the HPI with the patient and updated documentation appropriately.          Comments    3 Month AMD f\u OU. Possible Avastin OS. OCT  Pt states vision is doing well. Sees occasional floaters. 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       Last edited by Tilda Franco on 12/19/2019  9:06 AM. (History)      Referring physician: Lawerance Cruel, MD [place],  [place] [address]  HISTORICAL INFORMATION:   Selected notes from the [facility]: No current outpatient medications on file. (Ophthalmic Drugs)   No current facility-administered medications for this visit. (Ophthalmic Drugs)   Current Outpatient Medications (Other)  Medication Sig  . amLODipine (NORVASC) 5 MG tablet Take 5 mg by mouth daily.  . Ascorbic Acid (VITAMIN C PO) Take 1 tablet by mouth daily.  Marland Kitchen aspirin EC 81 MG tablet Take 81 mg by mouth daily.  Marland Kitchen atorvastatin (LIPITOR) 40 MG tablet   . b complex vitamins capsule Take 1 capsule by mouth daily.  Marland Kitchen BYSTOLIC 5 MG tablet TAKE 1 TABLET BY MOUTH EVERY DAY  . calcium carbonate (CALCIUM 600) 600 MG TABS tablet Take 600 mg by mouth daily.  . Cholecalciferol (VITAMIN D-3 PO) Take 2,000 Units by mouth daily.  . hydrochlorothiazide (HYDRODIURIL) 25 MG tablet Take 25 mg by mouth daily.  . metFORMIN (GLUCOPHAGE-XR) 500 MG 24 hr tablet   . Multiple Vitamin (MULTIVITAMIN) tablet Take 1 tablet by mouth daily.  . Multiple Vitamins-Minerals (PRESERVISION AREDS 2 PO) Take 2 capsules by mouth daily.  . potassium chloride SA (KLOR-CON M20) 20 MEQ tablet  Take 1 tablet (20 mEq total) by mouth daily.  . Red Yeast Rice 600 MG TABS Take 2 tablets by mouth daily.    No current facility-administered medications for this visit. (Other)      REVIEW OF SYSTEMS:    ALLERGIES Allergies  Allergen Reactions  . Ace Inhibitors Cough  . Penicillins Rash    PAST MEDICAL HISTORY Past Medical History:  Diagnosis Date  . Chest pressure   . Hyperlipidemia   . Hypertension   . Palpitations    Past Surgical History:  Procedure Laterality Date  . CESAREAN SECTION    . OVARY SURGERY      FAMILY HISTORY Family History  Problem Relation Age of Onset  . Dementia Mother   . Diabetes Brother     SOCIAL HISTORY Social History   Tobacco Use  . Smoking status: Never Smoker  . Smokeless tobacco: Never Used  Substance Use Topics  . Alcohol use: Never  . Drug use: Never         OPHTHALMIC EXAM: Base Eye Exam    Visual Acuity (Snellen - Linear)      Right Left   Dist cc 20/25 + 20/100 -1   Dist ph cc  20/80 -2       Tonometry (Tonopen, 9:11 AM)  Right Left   Pressure 10 10       Pupils      Pupils Dark Light Shape React APD   Right PERRL 4.5 3 Round Brisk None   Left PERRL 4.5 3 Round Brisk None       Visual Fields (Counting fingers)      Left Right    Full Full       Neuro/Psych    Oriented x3: Yes   Mood/Affect: Normal       Dilation    Both eyes: 1.0% Mydriacyl, 2.5% Phenylephrine @ 9:11 AM          IMAGING AND PROCEDURES  Imaging and Procedures for 12/19/19           ASSESSMENT/PLAN:  No problem-specific Assessment & Plan notes found for this encounter.      ICD-10-CM   1. Exudative age-related macular degeneration of left eye with active choroidal neovascularization (HCC)  H35.3221 OCT, Retina - OU - Both Eyes  2. Intermediate stage nonexudative age-related macular degeneration of both eyes  H35.3132 OCT, Retina - OU - Both Eyes  3. Exudative age-related macular degeneration of left eye  with inactive choroidal neovascularization (HCC)  H35.3222   4. Degenerative retinal drusen of right eye  H35.361   5. Degenerative retinal drusen of left eye  H35.362     1.OS, now on maintenance interval therapy and examination.  Currently at 39-month interval examination.  Will repeat intravitreal Avastin OS today and return visit follow-up OU with no planned intervention   2.OS, acuity is limited by subfoveal choriocapillaris and RPE atrophy.  Therapy for wet AMD has stabilized and prevented progression  3.  Ophthalmic Meds Ordered this visit:  No orders of the defined types were placed in this encounter.      No follow-ups on file.  There are no Patient Instructions on file for this visit.   Explained the diagnoses, plan, and follow up with the patient and they expressed understanding.  Patient expressed understanding of the importance of proper follow up care.   Alford Highland Naheem Mosco M.D. Diseases & Surgery of the Retina and Vitreous [facility] 12/19/19     Abbreviations: M myopia (nearsighted); A astigmatism; H hyperopia (farsighted); P presbyopia; Mrx spectacle prescription;  CTL contact lenses; OD right eye; OS left eye; OU both eyes  XT exotropia; ET esotropia; PEK punctate epithelial keratitis; PEE punctate epithelial erosions; DES dry eye syndrome; MGD meibomian gland dysfunction; ATs artificial tears; PFAT's preservative free artificial tears; NSC nuclear sclerotic cataract; PSC posterior subcapsular cataract; ERM epi-retinal membrane; PVD posterior vitreous detachment; RD retinal detachment; DM diabetes mellitus; DR diabetic retinopathy; NPDR non-proliferative diabetic retinopathy; PDR proliferative diabetic retinopathy; CSME clinically significant macular edema; DME diabetic macular edema; dbh dot blot hemorrhages; CWS cotton wool spot; POAG primary open angle glaucoma; C/D cup-to-disc ratio; HVF humphrey visual field; GVF goldmann visual field; OCT optical  coherence tomography; IOP intraocular pressure; BRVO Branch retinal vein occlusion; CRVO central retinal vein occlusion; CRAO central retinal artery occlusion; BRAO branch retinal artery occlusion; RT retinal tear; SB scleral buckle; PPV pars plana vitrectomy; VH Vitreous hemorrhage; PRP panretinal laser photocoagulation; IVK intravitreal kenalog; VMT vitreomacular traction; MH Macular hole;  NVD neovascularization of the disc; NVE neovascularization elsewhere; AREDS age related eye disease study; ARMD age related macular degeneration; POAG primary open angle glaucoma; EBMD epithelial/anterior basement membrane dystrophy; ACIOL anterior chamber intraocular lens; IOL intraocular lens; PCIOL posterior chamber intraocular lens; Phaco/IOL phacoemulsification with intraocular  lens placement; [place] photorefractive keratectomy; LASIK laser assisted in situ keratomileusis; HTN hypertension; DM diabetes mellitus; COPD chronic obstructive pulmonary disease

## 2019-12-19 NOTE — Assessment & Plan Note (Signed)
The nature of wet macular degeneration was discussed with the patient.  Forms of therapy reviewed include the use of Anti-VEGF medications injected painlessly into the eye, as well as other possible treatment modalities, including thermal laser therapy. Fellow eye involvement and risks were discussed with the patient. Upon the finding of wet age related macular degeneration, treatment will be offered. The treatment regimen is on a treat as needed basis with the intent to treat if necessary and extend interval of exams when possible. On average 1 out of 6 patients do not need lifetime therapy. However, the risk of recurrent disease is high for a lifetime.  Initially monthly, then periodic, examinations and evaluations will determine whether the next treatment is required on the day of the examination.  OS, now on maintenance interval therapy and examination.  Currently at 30-month interval examination.  Will repeat intravitreal Avastin OS today and return visit follow-up OU with no planned intervention

## 2020-03-19 DIAGNOSIS — I10 Essential (primary) hypertension: Secondary | ICD-10-CM | POA: Diagnosis not present

## 2020-03-19 DIAGNOSIS — E782 Mixed hyperlipidemia: Secondary | ICD-10-CM | POA: Diagnosis not present

## 2020-04-01 DIAGNOSIS — R7301 Impaired fasting glucose: Secondary | ICD-10-CM | POA: Diagnosis not present

## 2020-04-20 ENCOUNTER — Ambulatory Visit (INDEPENDENT_AMBULATORY_CARE_PROVIDER_SITE_OTHER): Payer: Medicare HMO | Admitting: Ophthalmology

## 2020-04-20 ENCOUNTER — Encounter (INDEPENDENT_AMBULATORY_CARE_PROVIDER_SITE_OTHER): Payer: Self-pay | Admitting: Ophthalmology

## 2020-04-20 ENCOUNTER — Other Ambulatory Visit: Payer: Self-pay

## 2020-04-20 DIAGNOSIS — H353132 Nonexudative age-related macular degeneration, bilateral, intermediate dry stage: Secondary | ICD-10-CM | POA: Diagnosis not present

## 2020-04-20 DIAGNOSIS — H353222 Exudative age-related macular degeneration, left eye, with inactive choroidal neovascularization: Secondary | ICD-10-CM

## 2020-04-20 DIAGNOSIS — H353124 Nonexudative age-related macular degeneration, left eye, advanced atrophic with subfoveal involvement: Secondary | ICD-10-CM | POA: Diagnosis not present

## 2020-04-20 DIAGNOSIS — H353221 Exudative age-related macular degeneration, left eye, with active choroidal neovascularization: Secondary | ICD-10-CM

## 2020-04-20 NOTE — Progress Notes (Signed)
04/20/2020     CHIEF COMPLAINT Patient presents for Retina Follow Up   HISTORY OF PRESENT ILLNESS: Jasmin Young is a 79 y.o. female who presents to the clinic today for:   HPI    Retina Follow Up    Patient presents with  Other.  In both eyes.  This started 4 months ago.  Severity is mild.  Duration of 4 months.  Since onset it is stable.          Comments    4 Month F/U OU  Pt c/o "gunk" OD x 3 days, but sts she doesn't see anything coming out of eye. Pt denies ocular pain or changes to [place] OU.       Last edited by Ileana Roup, COA on 04/20/2020  8:55 AM. (History)      Referring physician: Daisy Floro, MD [address] [place],  [place] [address]  HISTORICAL INFORMATION:   Selected notes from the MEDICAL RECORD NUMBER       CURRENT MEDICATIONS: No current outpatient medications on file. (Ophthalmic Drugs)   No current facility-administered medications for this visit. (Ophthalmic Drugs)   Current Outpatient Medications (Other)  Medication Sig  . amLODipine (NORVASC) 5 MG tablet Take 5 mg by mouth daily.  . Ascorbic Acid (VITAMIN C PO) Take 1 tablet by mouth daily.  Marland Kitchen aspirin EC 81 MG tablet Take 81 mg by mouth daily.  Marland Kitchen atorvastatin (LIPITOR) 40 MG tablet   . b complex vitamins capsule Take 1 capsule by mouth daily.  Marland Kitchen BYSTOLIC 5 MG tablet TAKE 1 TABLET BY MOUTH EVERY DAY  . calcium carbonate (CALCIUM 600) 600 MG TABS tablet Take 600 mg by mouth daily.  . Cholecalciferol (VITAMIN D-3 PO) Take 2,000 Units by mouth daily.  . hydrochlorothiazide (HYDRODIURIL) 25 MG tablet Take 25 mg by mouth daily.  . metFORMIN (GLUCOPHAGE-XR) 500 MG 24 hr tablet   . Multiple Vitamin (MULTIVITAMIN) tablet Take 1 tablet by mouth daily.  . Multiple Vitamins-Minerals (PRESERVISION AREDS 2 PO) Take 2 capsules by mouth daily.  . potassium chloride SA (KLOR-CON M20) 20 MEQ tablet Take 1 tablet (20 mEq total) by mouth daily.  . Red Yeast Rice 600 MG TABS Take 2 tablets by  mouth daily.    No current facility-administered medications for this visit. (Other)      REVIEW OF SYSTEMS:    ALLERGIES Allergies  Allergen Reactions  . Ace Inhibitors Cough  . Penicillins Rash    PAST MEDICAL HISTORY Past Medical History:  Diagnosis Date  . Chest pressure   . Exudative age-related macular degeneration of left eye with active choroidal neovascularization (HCC) 12/19/2019  . Hyperlipidemia   . Hypertension   . Palpitations    Past Surgical History:  Procedure Laterality Date  . CESAREAN SECTION    . OVARY SURGERY      FAMILY HISTORY Family History  Problem Relation Age of Onset  . Dementia Mother   . Diabetes Brother     SOCIAL HISTORY Social History   Tobacco Use  . Smoking status: Never Smoker  . Smokeless tobacco: Never Used  Vaping Use  . Vaping Use: Never used  Substance Use Topics  . Alcohol use: Never  . Drug use: Never         OPHTHALMIC EXAM:  Base Eye Exam    Visual Acuity (ETDRS)      Right Left   Dist cc 20/25ecc +1 20/80 +1   Dist ph cc  NI   Correction: Glasses       Tonometry (Tonopen, 8:56 AM)      Right Left   Pressure 25 23       Tonometry #2 (Tonopen, 8:59 AM)      Right Left   Pressure 25        Pupils      Pupils Dark Light Shape React APD   Right PERRL 5 4 Round Brisk None   Left PERRL 5 4 Round Brisk None       Visual Fields (Counting fingers)      Left Right    Full Full       Extraocular Movement      Right Left    Full Full       Neuro/Psych    Oriented x3: Yes   Mood/Affect: Normal       Dilation    Both eyes: 1.0% Mydriacyl, 2.5% Phenylephrine @ 8:59 AM        Slit Lamp and Fundus Exam    External Exam      Right Left   External Normal Normal       Slit Lamp Exam      Right Left   Lids/Lashes Normal Normal   Conjunctiva/Sclera White and quiet White and quiet   Cornea Clear Clear   Anterior Chamber Deep and quiet Deep and quiet   Iris Round and reactive Round  and reactive   Lens Nuclear sclerosis Nuclear sclerosis   Anterior Vitreous Normal Normal       Fundus Exam      Right Left   Posterior Vitreous Posterior vitreous detachment Posterior vitreous detachment   Disc Peripapillary atrophy    C/D Ratio 0.1 0.4   Macula Hard drusen, Retinal pigment epithelial mottling, no membrane Geographic atrophy, with pigment into the FAZ, no fluid, no thickening, no hemorrhage   Vessels Normal Normal   Periphery Normal Normal          IMAGING AND PROCEDURES  Imaging and Procedures for 04/20/20  OCT, Retina - OU - Both Eyes       Right Eye Quality was good. Scan locations included subfoveal. Central Foveal Thickness: 249. Progression has been stable. Findings include no SRF, no IRF, central retinal atrophy, outer retinal atrophy.   Left Eye Quality was good. Scan locations included subfoveal. Central Foveal Thickness: 227. Progression has been stable. Findings include no IRF, abnormal foveal contour, no SRF, outer retinal atrophy.                 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 Exudative age-related macular degeneration of left eye with active choroidal neovascularization (HCC)  H35.3221 OCT, Retina - OU - Both Eyes  2. Intermediate stage nonexudative age-related macular degeneration of both eyes  H35.3132 OCT, Retina - OU - Both Eyes  3. 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 Advanced nonexudative age-related macular degeneration of left eye with subfoveal involvement  H35.3124      1.  No active disease from a dry ARMD at this point OU.  Acuity left eye is limited  by subfoveal atrophy. 2.  Parafoveal geographic atrophy is noted in the right eye.  3.  Ophthalmic Meds Ordered this visit:  No orders of the defined types were placed in this encounter.      Return in about 6 months (around 10/18/2020) for DILATE OU, OCT.  Patient Instructions  Patient to report any new onset visual acuity distortions or declines in either eye    Explained the diagnoses, plan, and follow up with the patient and they expressed understanding.  Patient expressed understanding of the importance of proper follow up care.   Alford Highland Jaanai Salemi M.D. Diseases & Surgery of the Retina and Vitreous [facility] 04/20/20     Abbreviations: M myopia (nearsighted); A astigmatism; H hyperopia (farsighted); P presbyopia; Mrx spectacle prescription;  CTL contact lenses; OD right eye; OS left eye; OU both eyes  XT exotropia; ET esotropia; PEK punctate epithelial keratitis; PEE punctate epithelial erosions; DES dry eye syndrome; MGD meibomian gland dysfunction; ATs artificial tears; PFAT's preservative free artificial tears; NSC nuclear sclerotic cataract; PSC posterior subcapsular cataract; ERM epi-retinal membrane; PVD posterior vitreous detachment; RD retinal detachment; DM diabetes mellitus; DR diabetic retinopathy; NPDR non-proliferative diabetic retinopathy; PDR proliferative diabetic retinopathy; CSME clinically significant macular edema; DME diabetic macular edema; dbh dot blot hemorrhages; CWS cotton wool spot; POAG primary open angle glaucoma; C/D cup-to-disc ratio; HVF humphrey visual field; GVF goldmann visual field; OCT optical coherence tomography; IOP intraocular pressure; BRVO Branch retinal vein occlusion; CRVO central retinal vein occlusion; CRAO central retinal artery occlusion; BRAO branch retinal artery occlusion; RT retinal tear; SB scleral buckle; PPV pars plana  vitrectomy; VH Vitreous hemorrhage; PRP panretinal laser photocoagulation; IVK intravitreal kenalog; VMT vitreomacular traction; MH Macular hole;  NVD neovascularization of the disc; NVE neovascularization elsewhere; AREDS age related eye disease study; ARMD age related macular degeneration; POAG primary open angle glaucoma; EBMD epithelial/anterior basement membrane dystrophy; ACIOL anterior chamber intraocular lens; IOL intraocular lens; PCIOL posterior chamber intraocular lens; Phaco/IOL phacoemulsification with intraocular lens placement; PRK photorefractive keratectomy; LASIK laser assisted in situ keratomileusis; HTN hypertension; DM diabetes mellitus; COPD chronic obstructive pulmonary disease

## 2020-04-20 NOTE — Assessment & Plan Note (Signed)
Stable, no active disease status post Avastin some 3 months previous currently at 43-month follow-up will observe

## 2020-04-20 NOTE — Assessment & Plan Note (Signed)
This is inactive at this time the atrophy accounts for acuity

## 2020-04-20 NOTE — Patient Instructions (Signed)
Patient to report any new onset visual acuity distortions or declines in either eye. 

## 2020-05-29 ENCOUNTER — Other Ambulatory Visit (HOSPITAL_BASED_OUTPATIENT_CLINIC_OR_DEPARTMENT_OTHER): Payer: Self-pay | Admitting: Internal Medicine

## 2020-05-29 ENCOUNTER — Ambulatory Visit: Payer: Medicare HMO | Attending: Internal Medicine

## 2020-05-29 DIAGNOSIS — Z23 Encounter for immunization: Secondary | ICD-10-CM

## 2020-05-29 NOTE — Progress Notes (Signed)
   Covid-19 Vaccination Clinic  Name:  Jasmin Young    MRN: [id number] DOB: [birth year]  05/29/2020  Jasmin Young was observed post Covid-19 immunization for 15 minutes without incident. She was provided with Vaccine Information Sheet and instruction to access the V-Safe system.  Vaccinated by [facility]  Jasmin Young was instructed to call 911 with any severe reactions post vaccine: Marland Kitchen Difficulty breathing  . Swelling of face and throat  . A fast heartbeat  . A bad rash all over body  . Dizziness and weakness

## 2020-06-03 MED FILL — PFIZER-BIONTECH COVID-19 VA: 30 | 1 days supply | Qty: 0 | Fill #0

## 2020-06-23 DIAGNOSIS — E782 Mixed hyperlipidemia: Secondary | ICD-10-CM | POA: Diagnosis not present

## 2020-06-23 DIAGNOSIS — I10 Essential (primary) hypertension: Secondary | ICD-10-CM | POA: Diagnosis not present

## 2020-09-07 DIAGNOSIS — I10 Essential (primary) hypertension: Secondary | ICD-10-CM | POA: Diagnosis not present

## 2020-09-07 DIAGNOSIS — E782 Mixed hyperlipidemia: Secondary | ICD-10-CM | POA: Diagnosis not present

## 2020-09-30 DIAGNOSIS — M1990 Unspecified osteoarthritis, unspecified site: Secondary | ICD-10-CM | POA: Diagnosis not present

## 2020-09-30 DIAGNOSIS — Z0000 Encounter for general adult medical examination without abnormal findings: Secondary | ICD-10-CM | POA: Diagnosis not present

## 2020-09-30 DIAGNOSIS — R7303 Prediabetes: Secondary | ICD-10-CM | POA: Diagnosis not present

## 2020-09-30 DIAGNOSIS — R42 Dizziness and giddiness: Secondary | ICD-10-CM | POA: Diagnosis not present

## 2020-09-30 DIAGNOSIS — E559 Vitamin D deficiency, unspecified: Secondary | ICD-10-CM | POA: Diagnosis not present

## 2020-09-30 DIAGNOSIS — E782 Mixed hyperlipidemia: Secondary | ICD-10-CM | POA: Diagnosis not present

## 2020-09-30 DIAGNOSIS — M8580 Other specified disorders of bone density and structure, unspecified site: Secondary | ICD-10-CM | POA: Diagnosis not present

## 2020-09-30 DIAGNOSIS — I10 Essential (primary) hypertension: Secondary | ICD-10-CM | POA: Diagnosis not present

## 2020-10-19 ENCOUNTER — Encounter (INDEPENDENT_AMBULATORY_CARE_PROVIDER_SITE_OTHER): Payer: Self-pay | Admitting: Ophthalmology

## 2020-10-19 ENCOUNTER — Other Ambulatory Visit: Payer: Self-pay

## 2020-10-19 ENCOUNTER — Ambulatory Visit (INDEPENDENT_AMBULATORY_CARE_PROVIDER_SITE_OTHER): Payer: Medicare HMO | Admitting: Ophthalmology

## 2020-10-19 DIAGNOSIS — H353112 Nonexudative age-related macular degeneration, right eye, intermediate dry stage: Secondary | ICD-10-CM | POA: Diagnosis not present

## 2020-10-19 DIAGNOSIS — H353221 Exudative age-related macular degeneration, left eye, with active choroidal neovascularization: Secondary | ICD-10-CM | POA: Diagnosis not present

## 2020-10-19 MED ORDER — BEVACIZUMAB 2.5 MG/0.1ML IZ SOSY
2.5000 mg | PREFILLED_SYRINGE | INTRAVITREAL | Status: AC | PRN
  Administered 2020-10-19: 2.5 mg via INTRAVITREAL

## 2020-10-19 NOTE — Assessment & Plan Note (Signed)
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  Intraretinal fluid subretinal fluid and CNVM hemorrhage now present will need to treat in order to prevent enlargement of scotoma left eye

## 2020-10-19 NOTE — Progress Notes (Signed)
10/19/2020     CHIEF COMPLAINT Patient presents for Retina Follow Up (6 Month f\u OU. OCT/Pt states vision is stable. Denies new floaters and FOL. )   HISTORY OF PRESENT ILLNESS: Jasmin Young is a 80 y.o. female who presents to the clinic today for:   HPI    Retina Follow Up    Patient presents with  Wet AMD.  In left eye.  Severity is moderate.  Duration of 6 months.  Since onset it is stable.  I, the attending physician,  performed the HPI with the patient and updated documentation appropriately. Additional comments: 6 Month f\u OU. OCT Pt states vision is stable. Denies new floaters and FOL.        Last edited by Elyse Jarvis on 10/19/2020  8:59 AM. (History)      Referring physician: Daisy Floro, MD [address] [place],  [place] [address]  HISTORICAL INFORMATION:   Selected notes from the MEDICAL RECORD NUMBER       CURRENT MEDICATIONS: No current outpatient medications on file. (Ophthalmic Drugs)   No current facility-administered medications for this visit. (Ophthalmic Drugs)   Current Outpatient Medications (Other)  Medication Sig  . amLODipine (NORVASC) 5 MG tablet Take 5 mg by mouth daily.  . Ascorbic Acid (VITAMIN C PO) Take 1 tablet by mouth daily.  Marland Kitchen aspirin EC 81 MG tablet Take 81 mg by mouth daily.  Marland Kitchen atorvastatin (LIPITOR) 40 MG tablet   . b complex vitamins capsule Take 1 capsule by mouth daily.  Marland Kitchen BYSTOLIC 5 MG tablet TAKE 1 TABLET BY MOUTH EVERY DAY  . calcium carbonate (CALCIUM 600) 600 MG TABS tablet Take 600 mg by mouth daily.  . Cholecalciferol (VITAMIN D-3 PO) Take 2,000 Units by mouth daily.  . hydrochlorothiazide (HYDRODIURIL) 25 MG tablet Take 25 mg by mouth daily.  . metFORMIN (GLUCOPHAGE-XR) 500 MG 24 hr tablet   . Multiple Vitamin (MULTIVITAMIN) tablet Take 1 tablet by mouth daily.  . Multiple Vitamins-Minerals (PRESERVISION AREDS 2 PO) Take 2 capsules by mouth daily.  . potassium chloride SA (KLOR-CON M20) 20 MEQ tablet  Take 1 tablet (20 mEq total) by mouth daily.  . Red Yeast Rice 600 MG TABS Take 2 tablets by mouth daily.    No current facility-administered medications for this visit. (Other)      REVIEW OF SYSTEMS:    ALLERGIES Allergies  Allergen Reactions  . Ace Inhibitors Cough  . Penicillins Rash    PAST MEDICAL HISTORY Past Medical History:  Diagnosis Date  . Chest pressure   . Exudative age-related macular degeneration of left eye with active choroidal neovascularization (HCC) 12/19/2019  . Hyperlipidemia   . Hypertension   . Palpitations    Past Surgical History:  Procedure Laterality Date  . CESAREAN SECTION    . OVARY SURGERY      FAMILY HISTORY Family History  Problem Relation Age of Onset  . Dementia Mother   . Diabetes Brother     SOCIAL HISTORY Social History   Tobacco Use  . Smoking status: Never Smoker  . Smokeless tobacco: Never Used  Vaping Use  . Vaping Use: Never used  Substance Use Topics  . Alcohol use: Never  . Drug use: Never         OPHTHALMIC EXAM:  Base Eye Exam    Visual Acuity (Snellen - Linear)      Right Left   Dist cc 20/25 +1 20/400   Dist ph  cc  20/200   Correction: Glasses       Tonometry (Tonopen, 9:04 AM)      Right Left   Pressure 15 14       Pupils      Pupils Dark Light Shape React APD   Right PERRL 4 3 Round Brisk None   Left PERRL 4 3 Round Brisk None       Visual Fields (Counting fingers)      Left Right    Full Full       Neuro/Psych    Oriented x3: Yes   Mood/Affect: Normal       Dilation    Both eyes: 1.0% Mydriacyl, 2.5% Phenylephrine @ 9:04 AM        Slit Lamp and Fundus Exam    External Exam      Right Left   External Normal Normal       Slit Lamp Exam      Right Left   Lids/Lashes Normal Normal   Conjunctiva/Sclera White and quiet White and quiet   Cornea Clear Clear   Anterior Chamber Deep and quiet Deep and quiet   Iris Round and reactive Round and reactive   Lens Nuclear  sclerosis Nuclear sclerosis   Anterior Vitreous Normal Normal       Fundus Exam      Right Left   Posterior Vitreous Posterior vitreous detachment Posterior vitreous detachment   Disc Peripapillary atrophy    C/D Ratio 0.1 0.4   Macula Hard drusen, Retinal pigment epithelial mottling, no membrane Geographic atrophy, with pigment into the FAZ, no fluid, no thickening, no hemorrhage   Vessels Normal Normal   Periphery Normal Normal          IMAGING AND PROCEDURES  Imaging and Procedures for 10/19/20  OCT, Retina - OU - Both Eyes       Right Eye Quality was good. Scan locations included subfoveal. Central Foveal Thickness: 257. Progression has been stable. Findings include retinal drusen , no SRF.   Left Eye Quality was good. Scan locations included subfoveal. Central Foveal Thickness: 418. Progression has worsened. Findings include abnormal foveal contour, retinal drusen , subretinal hyper-reflective material, subretinal fluid, cystoid macular edema.   Notes New onset subretinal hyper reflective material with intraretinal fluid CME and subretinal fluid left eye as a reactivation of CNVM.  We will need to restart therapy left eye  OD no signs of CNVM       Intravitreal Injection, Pharmacologic Agent - OS - Left Eye       Time Out 10/19/2020. 10:22 AM. Confirmed correct patient, procedure, site, and patient consented.   Anesthesia Topical anesthesia was used. Anesthetic medications included Akten 3.5%.   Procedure A 30 gauge needle was used.   Injection:  2.5 mg Bevacizumab (AVASTIN) 2.5mg /0.30mL SOSY   NDC: 57322-025-42, Lot: [id number]   Route: Intravitreal, Site: Left Eye  Post-op Post injection exam found visual acuity of at least counting fingers. The patient tolerated the procedure well. There were no complications. The patient received written and verbal post procedure care education. Post injection medications were not given.                  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  Intraretinal fluid subretinal fluid and CNVM hemorrhage now present will need to treat in order to prevent enlargement  of scotoma left eye      ICD-10-CM   1. Exudative age-related macular degeneration of left eye with active choroidal neovascularization (HCC)  H35.3221 OCT, Retina - OU - Both Eyes    Intravitreal Injection, Pharmacologic Agent - OS - Left Eye    bevacizumab (AVASTIN) SOSY 2.5 mg  2. Intermediate stage nonexudative age-related macular degeneration of right eye  H35.3112     1.  Recurrent CNVM left eye today, nearly 1 year post last therapy.  To resume therapy OS in order to prevent scotoma enlargement.  2.  Risk and benefits reviewed.  Intravitreal Avastin delivered OS today.  3.  Ophthalmic Meds Ordered this visit:  Meds ordered this encounter  Medications  . bevacizumab (AVASTIN) SOSY 2.5 mg       Return in about 5 weeks (around 11/23/2020) for OPTOS FFA L/R, COLOR FP, DILATE OU, AVASTIN OCT, OS.  There are no Patient Instructions on file for this visit.   Explained the diagnoses, plan, and follow up with the patient and they expressed understanding.  Patient expressed understanding of the importance of proper follow up care.   Alford Highland Ziyon Soltau M.D. Diseases & Surgery of the Retina and Vitreous [facility] 10/19/20     Abbreviations: M myopia (nearsighted); A astigmatism; H hyperopia (farsighted); P presbyopia; Mrx spectacle prescription;  CTL contact lenses; OD right eye; OS left eye; OU both eyes  XT exotropia; ET esotropia; PEK punctate epithelial keratitis; PEE punctate epithelial erosions; DES dry eye syndrome; MGD meibomian gland dysfunction; ATs artificial tears; PFAT's preservative free artificial tears; NSC nuclear sclerotic  cataract; PSC posterior subcapsular cataract; ERM epi-retinal membrane; PVD posterior vitreous detachment; RD retinal detachment; DM diabetes mellitus; DR diabetic retinopathy; NPDR non-proliferative diabetic retinopathy; PDR proliferative diabetic retinopathy; CSME clinically significant macular edema; DME diabetic macular edema; dbh dot blot hemorrhages; CWS cotton wool spot; POAG primary open angle glaucoma; C/D cup-to-disc ratio; HVF humphrey visual field; GVF goldmann visual field; OCT optical coherence tomography; IOP intraocular pressure; BRVO Branch retinal vein occlusion; CRVO central retinal vein occlusion; CRAO central retinal artery occlusion; BRAO branch retinal artery occlusion; RT retinal tear; SB scleral buckle; PPV pars plana vitrectomy; VH Vitreous hemorrhage; PRP panretinal laser photocoagulation; IVK intravitreal kenalog; VMT vitreomacular traction; MH Macular hole;  NVD neovascularization of the disc; NVE neovascularization elsewhere; AREDS age related eye disease study; ARMD age related macular degeneration; POAG primary open angle glaucoma; EBMD epithelial/anterior basement membrane dystrophy; ACIOL anterior chamber intraocular lens; IOL intraocular lens; PCIOL posterior chamber intraocular lens; Phaco/IOL phacoemulsification with intraocular lens placement; PRK photorefractive keratectomy; LASIK laser assisted in situ keratomileusis; HTN hypertension; DM diabetes mellitus; COPD chronic obstructive pulmonary disease

## 2020-10-27 DIAGNOSIS — M1990 Unspecified osteoarthritis, unspecified site: Secondary | ICD-10-CM | POA: Diagnosis not present

## 2020-10-27 DIAGNOSIS — E782 Mixed hyperlipidemia: Secondary | ICD-10-CM | POA: Diagnosis not present

## 2020-10-27 DIAGNOSIS — I10 Essential (primary) hypertension: Secondary | ICD-10-CM | POA: Diagnosis not present

## 2020-10-27 DIAGNOSIS — M8580 Other specified disorders of bone density and structure, unspecified site: Secondary | ICD-10-CM | POA: Diagnosis not present

## 2020-11-23 ENCOUNTER — Encounter (INDEPENDENT_AMBULATORY_CARE_PROVIDER_SITE_OTHER): Payer: Medicare HMO | Admitting: Ophthalmology

## 2020-11-23 ENCOUNTER — Encounter (INDEPENDENT_AMBULATORY_CARE_PROVIDER_SITE_OTHER): Payer: Self-pay | Admitting: Ophthalmology

## 2020-11-23 ENCOUNTER — Ambulatory Visit (INDEPENDENT_AMBULATORY_CARE_PROVIDER_SITE_OTHER): Payer: Medicare HMO | Admitting: Ophthalmology

## 2020-11-23 ENCOUNTER — Other Ambulatory Visit: Payer: Self-pay

## 2020-11-23 DIAGNOSIS — H353124 Nonexudative age-related macular degeneration, left eye, advanced atrophic with subfoveal involvement: Secondary | ICD-10-CM | POA: Diagnosis not present

## 2020-11-23 DIAGNOSIS — H353221 Exudative age-related macular degeneration, left eye, with active choroidal neovascularization: Secondary | ICD-10-CM | POA: Diagnosis not present

## 2020-11-23 MED ORDER — BEVACIZUMAB 2.5 MG/0.1ML IZ SOSY
2.5000 mg | PREFILLED_SYRINGE | INTRAVITREAL | Status: AC | PRN
  Administered 2020-11-23: 2.5 mg via INTRAVITREAL

## 2020-11-23 MED ORDER — FLUORESCEIN SODIUM 10 % IV SOLN
500.0000 mg | INTRAVENOUS | Status: AC | PRN
  Administered 2020-11-23: 500 mg via INTRAVENOUS

## 2020-11-23 NOTE — Assessment & Plan Note (Signed)
Accounts for acuity yet CNVM on the temporal aspect of this lesion

## 2020-11-23 NOTE — Progress Notes (Signed)
11/23/2020     CHIEF COMPLAINT Patient presents for Retina Follow Up (5 Wk F/U OU, FP/FFA L/R, poss Avastin OS//Pt sts VA OS has improved since last visit. Pt denies ocular pain, flashes of light, or floaters OU. //)   HISTORY OF PRESENT ILLNESS: Jasmin Young is a 80 y.o. female who presents to the clinic today for:   HPI    Retina Follow Up    Patient presents with  Wet AMD.  In left eye.  This started 5 weeks ago.  Severity is mild.  Duration of 5 weeks.  Since onset it is gradually improving. Additional comments: 5 Wk F/U OU, FP/FFA L/R, poss Avastin OS  Pt sts VA OS has improved since last visit. Pt denies ocular pain, flashes of light, or floaters OU.          Last edited by Ileana Roup, COA on 11/23/2020 10:40 AM. (History)      Referring physician: Daisy Floro, MD [address] [place],  [place] [address]  HISTORICAL INFORMATION:   Selected notes from the MEDICAL RECORD NUMBER       CURRENT MEDICATIONS: No current outpatient medications on file. (Ophthalmic Drugs)   No current facility-administered medications for this visit. (Ophthalmic Drugs)   Current Outpatient Medications (Other)  Medication Sig  . amLODipine (NORVASC) 5 MG tablet Take 5 mg by mouth daily.  . Ascorbic Acid (VITAMIN C PO) Take 1 tablet by mouth daily.  Marland Kitchen aspirin EC 81 MG tablet Take 81 mg by mouth daily.  Marland Kitchen atorvastatin (LIPITOR) 40 MG tablet   . b complex vitamins capsule Take 1 capsule by mouth daily.  Marland Kitchen BYSTOLIC 5 MG tablet TAKE 1 TABLET BY MOUTH EVERY DAY  . calcium carbonate (CALCIUM 600) 600 MG TABS tablet Take 600 mg by mouth daily.  . Cholecalciferol (VITAMIN D-3 PO) Take 2,000 Units by mouth daily.  Marland Kitchen COVID-19 mRNA vaccine, Pfizer, 30 MCG/0.3ML injection INJECT AS DIRECTED  . hydrochlorothiazide (HYDRODIURIL) 25 MG tablet Take 25 mg by mouth daily.  . metFORMIN (GLUCOPHAGE-XR) 500 MG 24 hr tablet   . Multiple Vitamin (MULTIVITAMIN) tablet Take 1 tablet by mouth  daily.  . Multiple Vitamins-Minerals (PRESERVISION AREDS 2 PO) Take 2 capsules by mouth daily.  . potassium chloride SA (KLOR-CON M20) 20 MEQ tablet Take 1 tablet (20 mEq total) by mouth daily.  . Red Yeast Rice 600 MG TABS Take 2 tablets by mouth daily.    No current facility-administered medications for this visit. (Other)      REVIEW OF SYSTEMS:    ALLERGIES Allergies  Allergen Reactions  . Ace Inhibitors Cough  . Penicillins Rash    PAST MEDICAL HISTORY Past Medical History:  Diagnosis Date  . Chest pressure   . Exudative age-related macular degeneration of left eye with active choroidal neovascularization (HCC) 12/19/2019  . Hyperlipidemia   . Hypertension   . Palpitations    Past Surgical History:  Procedure Laterality Date  . CESAREAN SECTION    . OVARY SURGERY      FAMILY HISTORY Family History  Problem Relation Age of Onset  . Dementia Mother   . Diabetes Brother     SOCIAL HISTORY Social History   Tobacco Use  . Smoking status: Never Smoker  . Smokeless tobacco: Never Used  Vaping Use  . Vaping Use: Never used  Substance Use Topics  . Alcohol use: Never  . Drug use: Never         OPHTHALMIC  EXAM:  Base Eye Exam    Visual Acuity (ETDRS)      Right Left   Dist cc 20/25 +2 20/400   Dist ph cc  NI   Correction: Glasses       Tonometry (Tonopen, 10:44 AM)      Right Left   Pressure 21 20       Pupils      Pupils Dark Light Shape React APD   Right PERRL 5 4 Round Brisk None   Left PERRL 5 4 Round Brisk None       Visual Fields (Counting fingers)      Left Right    Full Full       Extraocular Movement      Right Left    Full Full       Neuro/Psych    Oriented x3: Yes   Mood/Affect: Normal       Dilation    Both eyes: 1.0% Mydriacyl, 2.5% Phenylephrine @ 10:44 AM        Slit Lamp and Fundus Exam    External Exam      Right Left   External Normal Normal       Slit Lamp Exam      Right Left   Lids/Lashes Normal  Normal   Conjunctiva/Sclera White and quiet White and quiet   Cornea Clear Clear   Anterior Chamber Deep and quiet Deep and quiet   Iris Round and reactive Round and reactive   Lens Nuclear sclerosis Nuclear sclerosis   Anterior Vitreous Normal Normal       Fundus Exam      Right Left   Posterior Vitreous Posterior vitreous detachment Posterior vitreous detachment   Disc Peripapillary atrophy    C/D Ratio 0.1 0.4   Macula Hard drusen, Retinal pigment epithelial mottling, no membrane Geographic atrophy, with pigment into the FAZ, no fluid, no thickening, no hemorrhage   Vessels Normal Normal   Periphery Normal Normal          IMAGING AND PROCEDURES  Imaging and Procedures for 11/23/20  OCT, Retina - OU - Both Eyes       Right Eye Quality was good. Scan locations included subfoveal. Central Foveal Thickness: 256. Progression has been stable. Findings include retinal drusen , no SRF.   Left Eye Quality was good. Scan locations included subfoveal. Central Foveal Thickness: 347. Progression has improved. Findings include abnormal foveal contour, retinal drusen , subretinal hyper-reflective material, cystoid macular edema, no IRF, no SRF.   Notes Stable, much less intraretinal fluid and subretinal fluid 1 month postinjection Avastin for new recurrence.  Will repeat injection OS today and examination again in 6 weeks       Color Fundus Photography Optos - OU - Both Eyes       Right Eye Progression has improved. Macula : drusen. Vessels : normal observations. Periphery : normal observations.   Left Eye Progression has improved. Disc findings include normal observations. Macula : geographic atrophy, drusen.   Notes OS, with geographic atrophy and pigmentary change,       Fluorescein Angiography Optos (Transit OS)       Injection:  500 mg Fluorescein Sodium 10 % injection   NDC: [phone]   Route: IntravenousRight Eye   Progression has been stable. Mid/Late  phase findings include window defect, staining. Choroidal neovascularization is not present.   Left Eye   Progression has improved. Early phase findings include window defect. Mid/Late phase findings include window  defect. Choroidal neovascularization is subfoveal, occult.   Notes OS, staining and slight leakage inferotemporal to the fovea coming from prior CNVM 1 month post recent injection Avastin, will repeat injection today  OD no CNVM       Intravitreal Injection, Pharmacologic Agent - OS - Left Eye       Time Out 11/23/2020. 11:17 AM. Confirmed correct patient, procedure, site, and patient consented.   Anesthesia Topical anesthesia was used. Anesthetic medications included Akten 3.5%.   Procedure Preparation included 5% betadine to ocular surface, 10% betadine to eyelids, Tobramycin 0.3%. A 30 gauge needle was used.   Injection:  2.5 mg Bevacizumab (AVASTIN) 2.5mg /0.61mL SOSY   NDC: 28413-244-01, Lot: [id number]   Route: Intravitreal, Site: Left Eye  Post-op Post injection exam found visual acuity of at least counting fingers. The patient tolerated the procedure well. There were no complications. The patient received written and verbal post procedure care education. Post injection medications were not given.                 ASSESSMENT/PLAN:  Advanced nonexudative age-related macular degeneration of left eye with subfoveal involvement Accounts for acuity yet CNVM on the temporal aspect of this lesion  Exudative age-related macular degeneration of left eye with active choroidal neovascularization (HCC) Onset March 2022, with improved anatomy 1 month post injection Avastin, site of leakage confirmed inferotemporal to fast today by FFA, repeat injection today and again in 5 to 6 weeks      ICD-10-CM   1. Exudative age-related macular degeneration of left eye with active choroidal neovascularization (HCC)  H35.3221 OCT, Retina - OU - Both Eyes    Color Fundus  Photography Optos - OU - Both Eyes    Fluorescein Angiography Optos (Transit OS)    Fluorescein Sodium 10 % injection 500 mg    Intravitreal Injection, Pharmacologic Agent - OS - Left Eye    bevacizumab (AVASTIN) SOSY 2.5 mg  2. Advanced nonexudative age-related macular degeneration of left eye with subfoveal involvement  H35.3124     1.  OS site of leakage confirmed inferotemporal to FAZ by FFA today.  We will repeat injection OS today and examination next again in 6 weeks  2.  3.  Ophthalmic Meds Ordered this visit:  Meds ordered this encounter  Medications  . Fluorescein Sodium 10 % injection 500 mg  . bevacizumab (AVASTIN) SOSY 2.5 mg       Return in about 6 weeks (around 01/04/2021) for dilate, OS, AVASTIN OCT.  There are no Patient Instructions on file for this visit.   Explained the diagnoses, plan, and follow up with the patient and they expressed understanding.  Patient expressed understanding of the importance of proper follow up care.   Alford Highland Aamari West M.D. Diseases & Surgery of the Retina and Vitreous [facility] 11/23/20     Abbreviations: M myopia (nearsighted); A astigmatism; H hyperopia (farsighted); P presbyopia; Mrx spectacle prescription;  CTL contact lenses; OD right eye; OS left eye; OU both eyes  XT exotropia; ET esotropia; PEK punctate epithelial keratitis; PEE punctate epithelial erosions; DES dry eye syndrome; MGD meibomian gland dysfunction; ATs artificial tears; PFAT's preservative free artificial tears; NSC nuclear sclerotic cataract; PSC posterior subcapsular cataract; ERM epi-retinal membrane; PVD posterior vitreous detachment; RD retinal detachment; DM diabetes mellitus; DR diabetic retinopathy; NPDR non-proliferative diabetic retinopathy; PDR proliferative diabetic retinopathy; CSME clinically significant macular edema; DME diabetic macular edema; dbh dot blot hemorrhages; CWS cotton wool spot; POAG primary open  angle glaucoma; C/D  cup-to-disc ratio; HVF humphrey visual field; GVF goldmann visual field; OCT optical coherence tomography; IOP intraocular pressure; BRVO Branch retinal vein occlusion; CRVO central retinal vein occlusion; CRAO central retinal artery occlusion; BRAO branch retinal artery occlusion; RT retinal tear; SB scleral buckle; PPV pars plana vitrectomy; VH Vitreous hemorrhage; PRP panretinal laser photocoagulation; IVK intravitreal kenalog; VMT vitreomacular traction; MH Macular hole;  NVD neovascularization of the disc; NVE neovascularization elsewhere; AREDS age related eye disease study; ARMD age related macular degeneration; POAG primary open angle glaucoma; EBMD epithelial/anterior basement membrane dystrophy; ACIOL anterior chamber intraocular lens; IOL intraocular lens; PCIOL posterior chamber intraocular lens; Phaco/IOL phacoemulsification with intraocular lens placement; PRK photorefractive keratectomy; LASIK laser assisted in situ keratomileusis; HTN hypertension; DM diabetes mellitus; COPD chronic obstructive pulmonary disease

## 2020-11-23 NOTE — Assessment & Plan Note (Signed)
Onset March 2022, with improved anatomy 1 month post injection Avastin, site of leakage confirmed inferotemporal to fast today by FFA, repeat injection today and again in 5 to 6 weeks

## 2020-12-22 ENCOUNTER — Emergency Department (HOSPITAL_BASED_OUTPATIENT_CLINIC_OR_DEPARTMENT_OTHER)
Admission: EM | Admit: 2020-12-22 | Discharge: 2020-12-22 | Disposition: A | Discharge status: Home / Self Care | Payer: Medicare HMO | Attending: Emergency Medicine | Admitting: Emergency Medicine

## 2020-12-22 ENCOUNTER — Emergency Department (HOSPITAL_BASED_OUTPATIENT_CLINIC_OR_DEPARTMENT_OTHER): Payer: Medicare HMO

## 2020-12-22 ENCOUNTER — Telehealth: Payer: Self-pay | Admitting: Cardiology

## 2020-12-22 ENCOUNTER — Other Ambulatory Visit: Payer: Self-pay

## 2020-12-22 ENCOUNTER — Encounter (HOSPITAL_BASED_OUTPATIENT_CLINIC_OR_DEPARTMENT_OTHER): Payer: Self-pay | Admitting: Emergency Medicine

## 2020-12-22 ENCOUNTER — Other Ambulatory Visit (HOSPITAL_BASED_OUTPATIENT_CLINIC_OR_DEPARTMENT_OTHER): Payer: Self-pay

## 2020-12-22 ENCOUNTER — Telehealth: Payer: Self-pay

## 2020-12-22 DIAGNOSIS — Z7982 Long term (current) use of aspirin: Secondary | ICD-10-CM | POA: Diagnosis not present

## 2020-12-22 DIAGNOSIS — I10 Essential (primary) hypertension: Secondary | ICD-10-CM | POA: Diagnosis not present

## 2020-12-22 DIAGNOSIS — R079 Chest pain, unspecified: Secondary | ICD-10-CM | POA: Diagnosis not present

## 2020-12-22 DIAGNOSIS — Z79899 Other long term (current) drug therapy: Secondary | ICD-10-CM | POA: Insufficient documentation

## 2020-12-22 DIAGNOSIS — R0789 Other chest pain: Secondary | ICD-10-CM | POA: Diagnosis not present

## 2020-12-22 DIAGNOSIS — R072 Precordial pain: Secondary | ICD-10-CM | POA: Insufficient documentation

## 2020-12-22 LAB — CBC WITH DIFFERENTIAL/PLATELET
Abs Immature Granulocytes: 0.01 10*3/uL (ref 0.00–0.07)
Basophils Absolute: 0.1 10*3/uL (ref 0.0–0.1)
Basophils Relative: 1 %
Eosinophils Absolute: 0.2 10*3/uL (ref 0.0–0.5)
Eosinophils Relative: 4 %
HCT: 42.4 % (ref 36.0–46.0)
Hemoglobin: 13.9 g/dL (ref 12.0–15.0)
Immature Granulocytes: 0 %
Lymphocytes Relative: 40 %
Lymphs Abs: 2.6 10*3/uL (ref 0.7–4.0)
MCH: 30.3 pg (ref 26.0–34.0)
MCHC: 32.8 g/dL (ref 30.0–36.0)
MCV: 92.6 fL (ref 80.0–100.0)
Monocytes Absolute: 0.5 10*3/uL (ref 0.1–1.0)
Monocytes Relative: 7 %
Neutro Abs: 3.2 10*3/uL (ref 1.7–7.7)
Neutrophils Relative %: 48 %
Platelets: 191 10*3/uL (ref 150–400)
RBC: 4.58 MIL/uL (ref 3.87–5.11)
RDW: 13.1 % (ref 11.5–15.5)
WBC: 6.6 10*3/uL (ref 4.0–10.5)
nRBC: 0 % (ref 0.0–0.2)

## 2020-12-22 LAB — BASIC METABOLIC PANEL
Anion gap: 12 (ref 5–15)
BUN: 16 mg/dL (ref 8–23)
CO2: 25 mmol/L (ref 22–32)
Calcium: 9.6 mg/dL (ref 8.9–10.3)
Chloride: 101 mmol/L (ref 98–111)
Creatinine, Ser: 0.61 mg/dL (ref 0.44–1.00)
GFR, Estimated: >60 mL/min (ref >60)
Glucose, Bld: 110 mg/dL — ABNORMAL HIGH (ref 70–99)
Potassium: 3.4 mmol/L — ABNORMAL LOW (ref 3.5–5.1)
Sodium: 138 mmol/L (ref 135–145)

## 2020-12-22 LAB — TROPONIN I (HIGH SENSITIVITY)
Troponin I (High Sensitivity): 5 ng/L (ref <18)
Troponin I (High Sensitivity): 4 ng/L (ref <18)

## 2020-12-22 MED ORDER — FAMOTIDINE IN NACL 20-0.9 MG/50ML-% IV SOLN
20.0000 mg | Freq: Once | INTRAVENOUS | Status: AC
  Administered 2020-12-22: 20 mg via INTRAVENOUS
  Filled 2020-12-22: qty 50

## 2020-12-22 MED ORDER — FAMOTIDINE 20 MG PO TABS
20.0000 mg | ORAL_TABLET | Freq: Two times a day (BID) | ORAL | 0 refills | Status: DC
  Filled 2020-12-22: qty 60, 30d supply, fill #0

## 2020-12-22 MED ORDER — ALUM & MAG HYDROXIDE-SIMETH 200-200-20 MG/5ML PO SUSP
30.0000 mL | Freq: Once | ORAL | Status: AC
  Administered 2020-12-22: 30 mL via ORAL
  Filled 2020-12-22: qty 30

## 2020-12-22 MED ORDER — LIDOCAINE VISCOUS HCL 2 % MT SOLN
15.0000 mL | Freq: Once | OROMUCOSAL | Status: AC
  Administered 2020-12-22: 15 mL via ORAL
  Filled 2020-12-22: qty 15

## 2020-12-22 NOTE — Telephone Encounter (Signed)
Spoke to the patient just now and let her know Dr. Munley's recommendations. She verbalizes understanding and thanks me for the call back.  

## 2020-12-22 NOTE — ED Provider Notes (Signed)
[facility] EMERGENCY DEPARTMENT Provider Note   CSN: [id number] Arrival date & time: 12/22/20  1247     History Chief Complaint  Patient presents with  . Chest Pain    Jasmin Young is a 80 y.o. female.  Jasmin Young is a 80 y.o. female with a history of hypertension, hyperlipidemia, palpitations, who presents to the emergency department for evaluation of substernal chest pain.  She reports pain has been occurring intermittently over the last few weeks.  Initially was brief lasting a few minutes, but over the past few days has become more frequent and today chest pain has been persistent.  She reports it is not completely resolved and she still has a mild ache.  She reports chest pain has occurred at rest and with exertion.  It does not seem to be brought on or worsened by exertion.  Pain is not worse with movement or palpation.  No pain with inspiration.  She reports she had similar pain to this once before and had reassuring work-up.  She she has no history of known CAD or MI.  No history of PE, not on any blood thinners.  She does report that today she noticed pain seem to be worse in the right after she ate and she wondered if it could be an issue with her esophagus, she has had occasional indigestion before but does not routinely take any medication for indigestion or GERD.        Past Medical History:  Diagnosis Date  . Chest pressure   . Exudative age-related macular degeneration of left eye with active choroidal neovascularization (HCC) 12/19/2019  . Hyperlipidemia   . Hypertension   . Palpitations     Patient Active Problem List   Diagnosis Date Noted  . Intermediate stage nonexudative age-related macular degeneration of right eye 10/19/2020  . Exudative age-related macular degeneration of left eye with active choroidal neovascularization (HCC) 12/19/2019  . Degenerative retinal drusen of right eye 12/19/2019  . Degenerative retinal drusen of left eye  12/19/2019  . Advanced nonexudative age-related macular degeneration of left eye with subfoveal involvement 12/19/2019  . Hypokalemia 08/10/2018  . Essential hypertension 06/29/2018  . Hyperlipidemia 06/29/2018  . APC (atrial premature contractions) 06/29/2018    Past Surgical History:  Procedure Laterality Date  . CESAREAN SECTION    . OVARY SURGERY       OB History   No obstetric history on file.     Family History  Problem Relation Age of Onset  . Dementia Mother   . Diabetes Brother     Social History   Tobacco Use  . Smoking status: Never Smoker  . Smokeless tobacco: Never Used  Vaping Use  . Vaping Use: Never used  Substance Use Topics  . Alcohol use: Never  . Drug use: Never    Home Medications Prior to Admission medications   Medication Sig Start Date End Date Taking? Authorizing Provider  famotidine (PEPCID) 20 MG tablet Take 1 tablet (20 mg total) by mouth 2 (two) times daily. 12/22/20  Yes Dartha Lodge, PA-C  amLODipine (NORVASC) 5 MG tablet Take 5 mg by mouth daily. 05/30/18   [provider]  Ascorbic Acid (VITAMIN C PO) Take 1 tablet by mouth daily.    [provider]  aspirin EC 81 MG tablet Take 81 mg by mouth daily.    [provider]  atorvastatin (LIPITOR) 40 MG tablet  12/11/19   [provider]  b  complex vitamins capsule Take 1 capsule by mouth daily.    [provider]  BYSTOLIC 5 MG tablet TAKE 1 TABLET BY MOUTH EVERY DAY 02/22/19   Baldo Daub, MD  calcium carbonate (CALCIUM 600) 600 MG TABS tablet Take 600 mg by mouth daily.    [provider]  Cholecalciferol (VITAMIN D-3 PO) Take 2,000 Units by mouth daily.    [provider]  COVID-19 mRNA vaccine, Pfizer, 30 MCG/0.3ML injection INJECT AS DIRECTED 05/29/20 05/29/21  Judyann Munson, MD  hydrochlorothiazide (HYDRODIURIL) 25 MG tablet Take 25 mg by mouth daily. 05/30/18   [provider]  metFORMIN (GLUCOPHAGE-XR)  500 MG 24 hr tablet  12/11/19   [provider]  Multiple Vitamin (MULTIVITAMIN) tablet Take 1 tablet by mouth daily.    [provider]  Multiple Vitamins-Minerals (PRESERVISION AREDS 2 PO) Take 2 capsules by mouth daily.    [provider]  potassium chloride SA (KLOR-CON M20) 20 MEQ tablet Take 1 tablet (20 mEq total) by mouth daily. 01/23/19   Baldo Daub, MD  Red Yeast Rice 600 MG TABS Take 2 tablets by mouth daily.     [provider]    Allergies    Ace inhibitors and Penicillins  Review of Systems   Review of Systems  Constitutional: Negative for chills and fever.  HENT: Negative.   Respiratory: Negative for cough and shortness of breath.   Cardiovascular: Positive for chest pain. Negative for palpitations and leg swelling.  Gastrointestinal: Negative for abdominal pain, nausea and vomiting.  Genitourinary: Negative for dysuria.  Musculoskeletal: Negative for arthralgias and myalgias.  Skin: Negative for color change and rash.  Neurological: Negative for dizziness, syncope and light-headedness.  All other systems reviewed and are negative.   Physical Exam Updated Vital Signs BP 132/67   Pulse 64   Temp 98.7 F (37.1 C)   Resp (!) 21   Ht 5\' 3"  (1.6 m)   Wt 63 kg   SpO2 94%   BMI 24.62 kg/m   Physical Exam Vitals and nursing note reviewed.  Constitutional:      General: She is not in acute distress.    Appearance: She is well-developed. She is not diaphoretic.  HENT:     Head: Normocephalic and atraumatic.  Eyes:     General:        Right eye: No discharge.        Left eye: No discharge.     Pupils: Pupils are equal, round, and reactive to light.  Cardiovascular:     Rate and Rhythm: Normal rate and regular rhythm.     Pulses:          Radial pulses are 2+ on the right side and 2+ on the left side.       Dorsalis pedis pulses are 2+ on the right side and 2+ on the left side.     Heart sounds: Normal heart sounds. No  murmur heard. No friction rub. No gallop.   Pulmonary:     Effort: Pulmonary effort is normal. No respiratory distress.     Breath sounds: Normal breath sounds. No wheezing or rales.     Comments: Respirations equal and unlabored, patient able to speak in full sentences, lungs clear to auscultation bilaterally  Chest:     Chest wall: No tenderness.     Comments: Chest pain not reproducible with palpation Abdominal:     General: Bowel sounds are normal. There is no distension.  Palpations: Abdomen is soft. There is no mass.     Tenderness: There is no abdominal tenderness. There is no guarding.  Musculoskeletal:        General: No deformity.     Cervical back: Neck supple.     Right lower leg: No tenderness. No edema.     Left lower leg: No tenderness. No edema.  Skin:    General: Skin is warm and dry.     Capillary Refill: Capillary refill takes less than 2 seconds.  Neurological:     Mental Status: She is alert.     Coordination: Coordination normal.     Comments: Speech is clear, able to follow commands Moves extremities without ataxia, coordination intact  Psychiatric:        Mood and Affect: Mood normal.        Behavior: Behavior normal.     ED Results / Procedures / Treatments   Labs (all labs ordered are listed, but only abnormal results are displayed) Labs Reviewed  BASIC METABOLIC PANEL - Abnormal; Notable for the following components:      Result Value   Potassium 3.4 (*)    Glucose, Bld 110 (*)    All other components within normal limits  CBC WITH DIFFERENTIAL/PLATELET  TROPONIN I (HIGH SENSITIVITY)  TROPONIN I (HIGH SENSITIVITY)    EKG EKG Interpretation  Date/Time:  Tuesday Dec 22 2020 13:05:06 EDT Ventricular Rate:  72 PR Interval:  174 QRS Duration: 97 QT Interval:  423 QTC Calculation: 463 R Axis:   -19 Text Interpretation: Sinus rhythm Consider left atrial enlargement Borderline left axis deviation Abnormal R-wave progression, early  transition Confirmed by Alvester Chou [phone]) on 12/22/2020 2:00:56 PM   Radiology DG Chest Portable 1 View  Result Date: 12/22/2020 CLINICAL DATA:  Chest pain for 2 weeks EXAM: PORTABLE CHEST 1 VIEW COMPARISON:  09/01/2015 FINDINGS: The heart size and mediastinal contours are within normal limits. Atherosclerotic calcification of the aortic knob. No focal airspace consolidation, pleural effusion, or pneumothorax. The visualized skeletal structures are unremarkable. IMPRESSION: No active disease. Electronically Signed   By: Duanne Guess D.O.   On: 12/22/2020 14:44    Procedures Procedures   Medications Ordered in ED Medications  famotidine (PEPCID) IVPB 20 mg premix (0 mg Intravenous Stopped 12/22/20 1509)  alum & mag hydroxide-simeth (MAALOX/MYLANTA) 200-200-20 MG/5ML suspension 30 mL (30 mLs Oral Given 12/22/20 1422)    And  lidocaine (XYLOCAINE) 2 % viscous mouth solution 15 mL (15 mLs Oral Given 12/22/20 1422)    ED Course  I have reviewed the triage vital signs and the nursing notes.  Pertinent labs & imaging results that were available during my care of the patient were reviewed by me and considered in my medical decision making (see chart for details).    MDM Rules/Calculators/A&P                          Patient presents to the emergency department with chest pain. Patient nontoxic appearing, in no apparent distress, vitals without significant abnormality. Fairly benign physical exam.   Prior heart catheterization reviewed: Dr. Dulce Sellar  DDX: ACS, pulmonary embolism, dissection, pneumothorax, pneumonia, arrhythmia, severe anemia, MSK, GERD, anxiety, abdominal process .   Additional history obtained:  Additional history obtained from chart review & nursing note review.   EKG: Sinus rhythm with early transition, no ST changes or ischemic changes  Lab Tests:  I Ordered, reviewed, and interpreted labs, which included:  CBC: No leukocytosis, normal hemoglobin BMP: Mild  hypokalemia of 3.4, glucose 110, no other electrolyte derangements, normal renal function Troponin: Negative x2  Imaging Studies ordered:  I ordered imaging studies which included CXR, I independently reviewed, formal radiology impression shows:  No active cardiopulmonary disease  ED Course:   Heart Pathway Score 3- EKG without obvious acute ischemia, delta troponin negative, doubt ACS. Patient is low risk wells, PERC negative, doubt pulmonary embolism. Pain is not a tearing sensation, symmetric pulses, no widening of mediastinum on CXR, doubt dissection. Cardiac monitor reviewed, no notable arrhythmias or tachycardia.  Pain relieved with Pepcid and GI cocktail, suspect GERD and/or esophageal spasm may be contributing patient's symptoms.  Patient has appeared hemodynamically stable throughout ER visit and appears safe for discharge with close PCP/cardiology follow up.  We will have patient hold aspirin as she has been taking this for years but is not on it for a specific reason, we will have her use Pepcid twice daily and provided recommendations on dietary modification for acid reflux.  I discussed results, treatment plan, need for PCP follow-up, and return precautions with the patient. Provided opportunity for questions, patient confirmed understanding and is in agreement with plan.    Portions of this note were generated with Scientist, clinical (histocompatibility and immunogenetics). Dictation errors may occur despite best attempts at proofreading.  Final Clinical Impression(s) / ED Diagnoses Final diagnoses:  Central chest pain    Rx / DC Orders ED Discharge Orders         Ordered    famotidine (PEPCID) 20 MG tablet  2 times daily        12/22/20 1635           Dartha Lodge, [place] 12/22/20 1642    Gwyneth Sprout, MD 12/30/20 [phone]

## 2020-12-22 NOTE — Telephone Encounter (Signed)
Patient came in office wanting to see if she could be seen any sooner. She has papers from the ED saying she needs to be seen within a week. CB # [phone]

## 2020-12-22 NOTE — Discharge Instructions (Signed)
You were seen in the emergency department today for chest pain. Your work-up in the emergency department has been overall reassuring. Your labs have been fairly normal and or similar to previous blood work you have had done. Your EKG and the enzyme we use to check your heart did not show an acute heart attack at this time. Your chest x-ray was normal.   We would like you to follow up closely with your primary care provider and/or the cardiologist provided in your discharge instructions within 1 week.  Suspect her symptoms may be related to reflux and indigestion.  Hold aspirin for the next 2 weeks and use Pepcid twice daily before breakfast and dinner.  Use eating guide attached your paperwork today to help modify diet to limit reflux symptoms.  Return to the ER immediately should you experience any new or worsening symptoms including but not limited to return of pain, worsened pain, vomiting, shortness of breath, dizziness, lightheadedness, passing out, or any other concerns that you may have.

## 2020-12-22 NOTE — Telephone Encounter (Signed)
   Pt c/o of Chest Pain: STAT if CP now or developed within 24 hours  1. Are you having CP right now? No  2. Are you experiencing any other symptoms (ex. SOB, nausea, vomiting, sweating)? Dizziness   3. How long have you been experiencing CP? Couple of weeks  4. Is your CP continuous or coming and going? Comes and goes   5. Have you taken Nitroglycerin? No, don't have one ? Pt said she's been experiencing chest pressure that comes and goes, she denied SOB but she gets dizzy sometimes. She wanted to see Dr. Dulce Sellar and schedule appt in August (first avail appt of Dr. Dulce Sellar) she wanted to ask what she needs to do while waiting for Dr. Hulen Shouts appt.

## 2020-12-22 NOTE — ED Triage Notes (Signed)
Pt arrives pov with c/o mid-sternal CP and dizziness that is ongoing, CP last night while at rest. Pt PCP not able to see pt, sent to ED for pt comfort

## 2020-12-22 NOTE — Telephone Encounter (Signed)
Spoke to the patient just now and she let me know that her chest pain is more of a pressure that feels like her chest is tightening. She states that it does not happen all of the time but when it does it lasts for 10-15 minutes. She does not take anything for this and it will go away on its own. This is happening daily. The pressure is in the middle of her chest. She states that it is not really bad and she was not very concerned about it.   The dizziness that she is mentioning comes and goes as well but does not come with the chest pain. These are independent events.  She states that she is following up with her PCP in regards to this.    She has not taken any of her vital signs recently. Her blood pressure right now is 171/83 but she states that she just had breakfast, coffee, and went for a walk. Heart rate is 72.   She is not having chest pain or feeling dizzy at this time.   Medications are up to date at this time.   She is scheduled to see Dr. Dulce Sellar on 04/06/2021

## 2020-12-23 NOTE — Telephone Encounter (Signed)
Spoke to the patient just now and she let me know that she does not want to schedule an earlier appointment with another provider. She will wait and see him in August in [facility].    Encouraged patient to call back with any questions or concerns.

## 2021-01-06 ENCOUNTER — Encounter (INDEPENDENT_AMBULATORY_CARE_PROVIDER_SITE_OTHER): Payer: Medicare HMO | Admitting: Ophthalmology

## 2021-01-13 ENCOUNTER — Ambulatory Visit (INDEPENDENT_AMBULATORY_CARE_PROVIDER_SITE_OTHER): Payer: Medicare HMO | Admitting: Ophthalmology

## 2021-01-13 ENCOUNTER — Encounter (INDEPENDENT_AMBULATORY_CARE_PROVIDER_SITE_OTHER): Payer: Self-pay | Admitting: Ophthalmology

## 2021-01-13 ENCOUNTER — Other Ambulatory Visit: Payer: Self-pay

## 2021-01-13 DIAGNOSIS — H353221 Exudative age-related macular degeneration, left eye, with active choroidal neovascularization: Secondary | ICD-10-CM | POA: Diagnosis not present

## 2021-01-13 MED ORDER — BEVACIZUMAB 2.5 MG/0.1ML IZ SOSY
2.5000 mg | PREFILLED_SYRINGE | INTRAVITREAL | Status: AC | PRN
  Administered 2021-01-13: 2.5 mg via INTRAVITREAL

## 2021-01-13 NOTE — Progress Notes (Signed)
01/13/2021     CHIEF COMPLAINT Patient presents for Retina Follow Up (7 week fu OD and Avastin OS (r/s from 06/1)/Pt states [facility] OU stable since last visit. Pt denies FOL, floaters, or ocular pain OU. /A1C:6.2/LBS:does not check)   HISTORY OF PRESENT ILLNESS: Jasmin Young is a 80 y.o. female who presents to the clinic today for:   HPI    Retina Follow Up    Diagnosis: Wet AMD   Laterality: left eye   Onset: 7 weeks ago   Severity: mild   Duration: 7 weeks   Course: stable   Comments: 7 week fu OD and Avastin OS (r/s from 06/1) Pt states [facility] OU stable since last visit. Pt denies FOL, floaters, or ocular pain OU.  A1C:6.2 HGD:JMEQ not check          Comments    No interval change in acuity per patient's symptoms       Last edited by Edmon Crape, MD on 01/13/2021  9:53 AM. (History)      Referring physician: Daisy Floro, MD [address] [place],  [place] [address]  HISTORICAL INFORMATION:   Selected notes from the MEDICAL RECORD NUMBER       CURRENT MEDICATIONS: No current outpatient medications on file. (Ophthalmic Drugs)   No current facility-administered medications for this visit. (Ophthalmic Drugs)   Current Outpatient Medications (Other)  Medication Sig  . amLODipine (NORVASC) 5 MG tablet Take 5 mg by mouth daily.  . Ascorbic Acid (VITAMIN C PO) Take 1 tablet by mouth daily.  Marland Kitchen aspirin EC 81 MG tablet Take 81 mg by mouth daily.  Marland Kitchen atorvastatin (LIPITOR) 40 MG tablet   . b complex vitamins capsule Take 1 capsule by mouth daily.  Marland Kitchen BYSTOLIC 5 MG tablet TAKE 1 TABLET BY MOUTH EVERY DAY  . calcium carbonate (CALCIUM 600) 600 MG TABS tablet Take 600 mg by mouth daily.  . Cholecalciferol (VITAMIN D-3 PO) Take 2,000 Units by mouth daily.  Marland Kitchen COVID-19 mRNA vaccine, Pfizer, 30 MCG/0.3ML injection INJECT AS DIRECTED  . famotidine (PEPCID) 20 MG tablet Take 1 tablet (20 mg total) by mouth 2 (two) times daily.  . hydrochlorothiazide (HYDRODIURIL) 25 MG  tablet Take 25 mg by mouth daily.  . metFORMIN (GLUCOPHAGE-XR) 500 MG 24 hr tablet   . Multiple Vitamin (MULTIVITAMIN) tablet Take 1 tablet by mouth daily.  . Multiple Vitamins-Minerals (PRESERVISION AREDS 2 PO) Take 2 capsules by mouth daily.  . potassium chloride SA (KLOR-CON M20) 20 MEQ tablet Take 1 tablet (20 mEq total) by mouth daily.  . Red Yeast Rice 600 MG TABS Take 2 tablets by mouth daily.    No current facility-administered medications for this visit. (Other)      REVIEW OF SYSTEMS:    ALLERGIES Allergies  Allergen Reactions  . Ace Inhibitors Cough  . Penicillins Rash    PAST MEDICAL HISTORY Past Medical History:  Diagnosis Date  . Chest pressure   . Exudative age-related macular degeneration of left eye with active choroidal neovascularization (HCC) 12/19/2019  . Hyperlipidemia   . Hypertension   . Palpitations    Past Surgical History:  Procedure Laterality Date  . CESAREAN SECTION    . OVARY SURGERY      FAMILY HISTORY Family History  Problem Relation Age of Onset  . Dementia Mother   . Diabetes Brother     SOCIAL HISTORY Social History   Tobacco Use  . Smoking status: Never Smoker  .  Smokeless tobacco: Never Used  Vaping Use  . Vaping Use: Never used  Substance Use Topics  . Alcohol use: Never  . Drug use: Never         OPHTHALMIC EXAM: Base Eye Exam    Visual Acuity (ETDRS)      Right Left   Dist cc 20/30 -1 20/400   Dist ph cc NI NI   Correction: Glasses       Tonometry (Tonopen, 9:06 AM)      Right Left   Pressure 14 15       Pupils      Pupils Dark Light Shape React APD   Right PERRL 5 4 Round Brisk None   Left PERRL 5 4 Round Brisk None       Visual Fields (Counting fingers)      Left Right    Full Full       Extraocular Movement      Right Left    Full Full       Neuro/Psych    Oriented x3: Yes   Mood/Affect: Normal       Dilation    Left eye: 1.0% Mydriacyl, 2.5% Phenylephrine @ 9:06 AM         Slit Lamp and Fundus Exam    External Exam      Right Left   External Normal Normal       Slit Lamp Exam      Right Left   Lids/Lashes Normal Normal   Conjunctiva/Sclera White and quiet White and quiet   Cornea Clear Clear   Anterior Chamber Deep and quiet Deep and quiet   Iris Round and reactive Round and reactive   Lens Nuclear sclerosis Nuclear sclerosis   Anterior Vitreous Normal Normal       Fundus Exam      Right Left   Posterior Vitreous  Posterior vitreous detachment   Disc  Normal   C/D Ratio  0.4   Macula Normal Geographic atrophy, with pigment into the FAZ, no fluid, no thickening, no hemorrhage   Vessels  Normal   Periphery  Normal          IMAGING AND PROCEDURES  Imaging and Procedures for 01/13/21  OCT, Retina - OU - Both Eyes       Right Eye Quality was good. Scan locations included subfoveal. Central Foveal Thickness: 253. Progression has been stable. Findings include retinal drusen , no SRF.   Left Eye Quality was good. Scan locations included subfoveal. Central Foveal Thickness: 381. Progression has improved. Findings include abnormal foveal contour, retinal drusen , subretinal hyper-reflective material, cystoid macular edema, subretinal fluid, intraretinal fluid.   Notes Increase in subretinal superior and subfoveal and and intraretinal fluid juxta foveal perimacular OS at 6-week interval.  Will need repeat injection today and follow-up in 30 days         Intravitreal Injection, Pharmacologic Agent - OS - Left Eye       Time Out 01/13/2021. 9:54 AM. Confirmed correct patient, procedure, site, and patient consented.   Anesthesia Topical anesthesia was used. Anesthetic medications included Akten 3.5%.   Procedure Preparation included 5% betadine to ocular surface, 10% betadine to eyelids, Tobramycin 0.3%. A 30 gauge needle was used.   Injection:  2.5 mg Bevacizumab (AVASTIN) 2.5mg /0.62mL SOSY   NDC: 22297-989-21, Lot: [id number]   Route:  Intravitreal, Site: Left Eye  Post-op Post injection exam found visual acuity of at least counting fingers. The patient tolerated the procedure  well. There were no complications. The patient received written and verbal post procedure care education. Post injection medications were not given.                 ASSESSMENT/PLAN:  Exudative age-related macular degeneration of left eye with active choroidal neovascularization (HCC) Previously improved CNVM will observe subfoveal subfoveal location at 4 to 5-week interval now has slightly increased activity at 6-week follow-up interval with intraretinal and subretinal fluid.  Repeat injection today and follow-up again in 30 days OS  I explained that this will limit the size and extent and growth of scotoma.      ICD-10-CM   1. Exudative age-related macular degeneration of left eye with active choroidal neovascularization (HCC)  H35.3221 OCT, Retina - OU - Both Eyes    Intravitreal Injection, Pharmacologic Agent - OS - Left Eye    bevacizumab (AVASTIN) SOSY 2.5 mg    1.  OS today technically a planned 6-week follow-up and now on 7-week follow-up with increased disease activity serous subretinal fluid and intraretinal fluid.  We will repeat injection intravitreal Avastin now on examination next in 30 days OS  2.  3.  Ophthalmic Meds Ordered this visit:  Meds ordered this encounter  Medications  . bevacizumab (AVASTIN) SOSY 2.5 mg       Return in about 1 month (around 02/12/2021) for dilate, OS, AVASTIN OCT.  There are no Patient Instructions on file for this visit.   Explained the diagnoses, plan, and follow up with the patient and they expressed understanding.  Patient expressed understanding of the importance of proper follow up care.   Alford Highland Milus Fritze M.D. Diseases & Surgery of the Retina and Vitreous [facility] 01/13/21     Abbreviations: M myopia (nearsighted); A astigmatism; H hyperopia  (farsighted); P presbyopia; Mrx spectacle prescription;  CTL contact lenses; OD right eye; OS left eye; OU both eyes  XT exotropia; ET esotropia; PEK punctate epithelial keratitis; PEE punctate epithelial erosions; DES dry eye syndrome; MGD meibomian gland dysfunction; ATs artificial tears; PFAT's preservative free artificial tears; NSC nuclear sclerotic cataract; PSC posterior subcapsular cataract; ERM epi-retinal membrane; PVD posterior vitreous detachment; RD retinal detachment; DM diabetes mellitus; DR diabetic retinopathy; NPDR non-proliferative diabetic retinopathy; PDR proliferative diabetic retinopathy; CSME clinically significant macular edema; DME diabetic macular edema; dbh dot blot hemorrhages; CWS cotton wool spot; POAG primary open angle glaucoma; C/D cup-to-disc ratio; HVF humphrey visual field; GVF goldmann visual field; OCT optical coherence tomography; IOP intraocular pressure; BRVO Branch retinal vein occlusion; CRVO central retinal vein occlusion; CRAO central retinal artery occlusion; BRAO branch retinal artery occlusion; RT retinal tear; SB scleral buckle; PPV pars plana vitrectomy; VH Vitreous hemorrhage; PRP panretinal laser photocoagulation; IVK intravitreal kenalog; VMT vitreomacular traction; MH Macular hole;  NVD neovascularization of the disc; NVE neovascularization elsewhere; AREDS age related eye disease study; ARMD age related macular degeneration; POAG primary open angle glaucoma; EBMD epithelial/anterior basement membrane dystrophy; ACIOL anterior chamber intraocular lens; IOL intraocular lens; PCIOL posterior chamber intraocular lens; Phaco/IOL phacoemulsification with intraocular lens placement; PRK photorefractive keratectomy; LASIK laser assisted in situ keratomileusis; HTN hypertension; DM diabetes mellitus; COPD chronic obstructive pulmonary disease

## 2021-01-13 NOTE — Assessment & Plan Note (Signed)
Previously improved CNVM will observe subfoveal subfoveal location at 4 to 5-week interval now has slightly increased activity at 6-week follow-up interval with intraretinal and subretinal fluid.  Repeat injection today and follow-up again in 30 days OS  I explained that this will limit the size and extent and growth of scotoma.

## 2021-02-17 ENCOUNTER — Ambulatory Visit (INDEPENDENT_AMBULATORY_CARE_PROVIDER_SITE_OTHER): Payer: Medicare HMO | Admitting: Ophthalmology

## 2021-02-17 ENCOUNTER — Other Ambulatory Visit: Payer: Self-pay

## 2021-02-17 ENCOUNTER — Encounter (INDEPENDENT_AMBULATORY_CARE_PROVIDER_SITE_OTHER): Payer: Self-pay | Admitting: Ophthalmology

## 2021-02-17 DIAGNOSIS — H353112 Nonexudative age-related macular degeneration, right eye, intermediate dry stage: Secondary | ICD-10-CM

## 2021-02-17 DIAGNOSIS — H353221 Exudative age-related macular degeneration, left eye, with active choroidal neovascularization: Secondary | ICD-10-CM

## 2021-02-17 MED ORDER — BEVACIZUMAB 2.5 MG/0.1ML IZ SOSY
2.5000 mg | PREFILLED_SYRINGE | INTRAVITREAL | Status: AC | PRN
  Administered 2021-02-17: 2.5 mg via INTRAVITREAL

## 2021-02-17 NOTE — Assessment & Plan Note (Signed)
The nature of wet macular degeneration was discussed with the patient.  Forms of therapy reviewed include the use of Anti-VEGF medications injected painlessly into the eye, as well as other possible treatment modalities, including thermal laser therapy. Fellow eye involvement and risks were discussed with the patient. Upon the finding of wet age related macular degeneration, treatment will be offered. The treatment regimen is on a treat as needed basis with the intent to treat if necessary and extend interval of exams when possible. On average 1 out of 6 patients do not need lifetime therapy. However, the risk of recurrent disease is high for a lifetime.  Initially monthly, then periodic, examinations and evaluations will determine whether the next treatment is required on the day of the examination.  OS, subretinal hyper reflective material, much less disease activity CNVM 1 month post recent Avastin.  We will repeat today to maintain and prevent scotoma enlargement

## 2021-02-17 NOTE — Progress Notes (Signed)
02/17/2021     CHIEF COMPLAINT Patient presents for Retina Follow Up (5 week fu OS and Avastin OS/Pt states VA OU stable since last visit. Pt denies FOL, floaters, or ocular pain OU. Erroll Luna: 6.2/LBS: Does not check)   HISTORY OF PRESENT ILLNESS: Jasmin Young is a 80 y.o. female who presents to the clinic today for:   HPI     Retina Follow Up           Diagnosis: Wet AMD   Laterality: left eye   Onset: 5 weeks ago   Severity: mild   Duration: 5 weeks   Course: stable   Comments: 5 week fu OS and Avastin OS Pt states VA OU stable since last visit. Pt denies FOL, floaters, or ocular pain OU.  A1C: 6.2 LBS: Does not check       Last edited by Demetrios Loll, COA on 02/17/2021  9:19 AM.      Referring physician: Daisy Floro, MD [address] [place],  [place] [address]  HISTORICAL INFORMATION:   Selected notes from the MEDICAL RECORD NUMBER       CURRENT MEDICATIONS: No current outpatient medications on file. (Ophthalmic Drugs)   No current facility-administered medications for this visit. (Ophthalmic Drugs)   Current Outpatient Medications (Other)  Medication Sig   amLODipine (NORVASC) 5 MG tablet Take 5 mg by mouth daily.   Ascorbic Acid (VITAMIN C PO) Take 1 tablet by mouth daily.   aspirin EC 81 MG tablet Take 81 mg by mouth daily.   atorvastatin (LIPITOR) 40 MG tablet    b complex vitamins capsule Take 1 capsule by mouth daily.   BYSTOLIC 5 MG tablet TAKE 1 TABLET BY MOUTH EVERY DAY   calcium carbonate (CALCIUM 600) 600 MG TABS tablet Take 600 mg by mouth daily.   Cholecalciferol (VITAMIN D-3 PO) Take 2,000 Units by mouth daily.   COVID-19 mRNA vaccine, Pfizer, 30 MCG/0.3ML injection INJECT AS DIRECTED   famotidine (PEPCID) 20 MG tablet Take 1 tablet (20 mg total) by mouth 2 (two) times daily.   hydrochlorothiazide (HYDRODIURIL) 25 MG tablet Take 25 mg by mouth daily.   metFORMIN (GLUCOPHAGE-XR) 500 MG 24 hr tablet    Multiple Vitamin  (MULTIVITAMIN) tablet Take 1 tablet by mouth daily.   Multiple Vitamins-Minerals (PRESERVISION AREDS 2 PO) Take 2 capsules by mouth daily.   potassium chloride SA (KLOR-CON M20) 20 MEQ tablet Take 1 tablet (20 mEq total) by mouth daily.   Red Yeast Rice 600 MG TABS Take 2 tablets by mouth daily.    No current facility-administered medications for this visit. (Other)      REVIEW OF SYSTEMS:    ALLERGIES Allergies  Allergen Reactions   Ace Inhibitors Cough   Penicillins Rash    PAST MEDICAL HISTORY Past Medical History:  Diagnosis Date   Chest pressure    Exudative age-related macular degeneration of left eye with active choroidal neovascularization (HCC) 12/19/2019   Hyperlipidemia    Hypertension    Palpitations    Past Surgical History:  Procedure Laterality Date   CESAREAN SECTION     OVARY SURGERY      FAMILY HISTORY Family History  Problem Relation Age of Onset   Dementia Mother    Diabetes Brother     SOCIAL HISTORY Social History   Tobacco Use   Smoking status: Never   Smokeless tobacco: Never  Vaping Use   Vaping Use: Never used  Substance Use Topics  Alcohol use: Never   Drug use: Never         OPHTHALMIC EXAM:  Base Eye Exam     Visual Acuity (ETDRS)       Right Left   Dist [place] 20/25 -2 20/400   Dist ph [place]  NI         Tonometry (Tonopen, 9:21 AM)       Right Left   Pressure 13 15         Pupils       Pupils Dark Light Shape React APD   Right PERRL 5 4 Round Brisk None   Left PERRL 5 4 Round Brisk None         Visual Fields (Counting fingers)       Left Right    Full Full         Extraocular Movement       Right Left    Full Full         Neuro/Psych     Oriented x3: Yes   Mood/Affect: Normal         Dilation     Left eye: 1.0% Mydriacyl, 2.5% Phenylephrine @ 9:21 AM           Slit Lamp and Fundus Exam     External Exam       Right Left   External Normal Normal         Slit Lamp  Exam       Right Left   Lids/Lashes Normal Normal   Conjunctiva/Sclera White and quiet White and quiet   Cornea Clear Clear   Anterior Chamber Deep and quiet Deep and quiet   Iris Round and reactive Round and reactive   Lens Nuclear sclerosis Nuclear sclerosis   Anterior Vitreous Normal Normal         Fundus Exam       Right Left   Posterior Vitreous  Posterior vitreous detachment   Disc  Normal   C/D Ratio  0.4   Macula Normal Geographic atrophy, with pigment into the FAZ, no fluid, no thickening, no hemorrhage, clinically detectable   Vessels  Normal   Periphery  Normal            IMAGING AND PROCEDURES  Imaging and Procedures for 02/17/21  OCT, Retina - OU - Both Eyes       Right Eye Quality was good. Scan locations included subfoveal. Central Foveal Thickness: 252. Progression has been stable. Findings include retinal drusen , no SRF.   Left Eye Quality was good. Scan locations included subfoveal. Central Foveal Thickness: 300. Progression has improved. Findings include abnormal foveal contour, retinal drusen , subretinal hyper-reflective material.   Notes Much improved macular anatomy left eye with much less intraretinal fluid, CME and subretinal fluid.  In fact only subretinal hyper reflective material in the foveal location remain       Intravitreal Injection, Pharmacologic Agent - OS - Left Eye       Time Out 02/17/2021. 9:55 AM. Confirmed correct patient, procedure, site, and patient consented.   Anesthesia Topical anesthesia was used. Anesthetic medications included Akten 3.5%.   Procedure Preparation included 5% betadine to ocular surface, 10% betadine to eyelids, Tobramycin 0.3%. A 30 gauge needle was used.   Injection: 2.5 mg bevacizumab 2.5 MG/0.1ML   Route: Intravitreal, Site: Left Eye   NDC: [phone], Lot: [id number]   Post-op Post injection exam found visual acuity of at least counting fingers. The patient tolerated  the procedure  well. There were no complications. The patient received written and verbal post procedure care education. Post injection medications were not given.              ASSESSMENT/PLAN:  Exudative age-related macular degeneration of left eye with active choroidal neovascularization (HCC) The nature of wet macular degeneration was discussed with the patient.  Forms of therapy reviewed include the use of Anti-VEGF medications injected painlessly into the eye, as well as other possible treatment modalities, including thermal laser therapy. Fellow eye involvement and risks were discussed with the patient. Upon the finding of wet age related macular degeneration, treatment will be offered. The treatment regimen is on a treat as needed basis with the intent to treat if necessary and extend interval of exams when possible. On average 1 out of 6 patients do not need lifetime therapy. However, the risk of recurrent disease is high for a lifetime.  Initially monthly, then periodic, examinations and evaluations will determine whether the next treatment is required on the day of the examination.  OS, subretinal hyper reflective material, much less disease activity CNVM 1 month post recent Avastin.  We will repeat today to maintain and prevent scotoma enlargement  Intermediate stage nonexudative age-related macular degeneration of right eye No signs of CNVM OD     ICD-10-CM   1. Exudative age-related macular degeneration of left eye with active choroidal neovascularization (HCC)  H35.3221 OCT, Retina - OU - Both Eyes    Intravitreal Injection, Pharmacologic Agent - OS - Left Eye    bevacizumab (AVASTIN) SOSY 2.5 mg    2. Intermediate stage nonexudative age-related macular degeneration of right eye  H35.3112       1.  OS, vastly improved 1 month after recent Avastin with much less subretinal fluid much less intraretinal fluid and CME from CNVM activity.  Acuity stabilized.  We will repeat injection Avastin  today and maintain follow-up interval 1 month  2.  3.  Ophthalmic Meds Ordered this visit:  Meds ordered this encounter  Medications   bevacizumab (AVASTIN) SOSY 2.5 mg       Return in about 1 month (around 03/20/2021) for dilate, OS, AVASTIN OCT.  There are no Patient Instructions on file for this visit.   Explained the diagnoses, plan, and follow up with the patient and they expressed understanding.  Patient expressed understanding of the importance of proper follow up care.   Alford Highland Samari Gorby M.D. Diseases & Surgery of the Retina and Vitreous [facility] 02/17/21     Abbreviations: M myopia (nearsighted); A astigmatism; H hyperopia (farsighted); P presbyopia; Mrx spectacle prescription;  CTL contact lenses; OD right eye; OS left eye; OU both eyes  XT exotropia; ET esotropia; PEK punctate epithelial keratitis; PEE punctate epithelial erosions; DES dry eye syndrome; MGD meibomian gland dysfunction; ATs artificial tears; PFAT's preservative free artificial tears; NSC nuclear sclerotic cataract; PSC posterior subcapsular cataract; ERM epi-retinal membrane; PVD posterior vitreous detachment; RD retinal detachment; DM diabetes mellitus; DR diabetic retinopathy; NPDR non-proliferative diabetic retinopathy; PDR proliferative diabetic retinopathy; CSME clinically significant macular edema; DME diabetic macular edema; dbh dot blot hemorrhages; CWS cotton wool spot; POAG primary open angle glaucoma; C/D cup-to-disc ratio; HVF humphrey visual field; GVF goldmann visual field; OCT optical coherence tomography; IOP intraocular pressure; BRVO Branch retinal vein occlusion; CRVO central retinal vein occlusion; CRAO central retinal artery occlusion; BRAO branch retinal artery occlusion; RT retinal tear; SB scleral buckle; PPV pars plana vitrectomy; VH Vitreous hemorrhage;  PRP panretinal laser photocoagulation; IVK intravitreal kenalog; VMT vitreomacular traction; MH Macular hole;  NVD  neovascularization of the disc; NVE neovascularization elsewhere; AREDS age related eye disease study; ARMD age related macular degeneration; POAG primary open angle glaucoma; EBMD epithelial/anterior basement membrane dystrophy; ACIOL anterior chamber intraocular lens; IOL intraocular lens; PCIOL posterior chamber intraocular lens; Phaco/IOL phacoemulsification with intraocular lens placement; PRK photorefractive keratectomy; LASIK laser assisted in situ keratomileusis; HTN hypertension; DM diabetes mellitus; COPD chronic obstructive pulmonary disease

## 2021-02-17 NOTE — Assessment & Plan Note (Signed)
No signs of CNVM OD 

## 2021-03-03 DIAGNOSIS — I10 Essential (primary) hypertension: Secondary | ICD-10-CM | POA: Diagnosis not present

## 2021-03-03 DIAGNOSIS — E782 Mixed hyperlipidemia: Secondary | ICD-10-CM | POA: Diagnosis not present

## 2021-03-22 ENCOUNTER — Ambulatory Visit (INDEPENDENT_AMBULATORY_CARE_PROVIDER_SITE_OTHER): Payer: Medicare HMO | Admitting: Ophthalmology

## 2021-03-22 ENCOUNTER — Encounter (INDEPENDENT_AMBULATORY_CARE_PROVIDER_SITE_OTHER): Payer: Self-pay | Admitting: Ophthalmology

## 2021-03-22 ENCOUNTER — Other Ambulatory Visit: Payer: Self-pay

## 2021-03-22 DIAGNOSIS — H353124 Nonexudative age-related macular degeneration, left eye, advanced atrophic with subfoveal involvement: Secondary | ICD-10-CM | POA: Diagnosis not present

## 2021-03-22 DIAGNOSIS — H353221 Exudative age-related macular degeneration, left eye, with active choroidal neovascularization: Secondary | ICD-10-CM | POA: Diagnosis not present

## 2021-03-22 MED ORDER — BEVACIZUMAB 2.5 MG/0.1ML IZ SOSY
2.5000 mg | PREFILLED_SYRINGE | INTRAVITREAL | Status: AC | PRN
  Administered 2021-03-22: 2.5 mg via INTRAVITREAL

## 2021-03-22 NOTE — Assessment & Plan Note (Signed)
Accounts for some component of acuity loss left eye

## 2021-03-22 NOTE — Progress Notes (Signed)
03/22/2021     CHIEF COMPLAINT Patient presents for Retina Follow Up   HISTORY OF PRESENT ILLNESS: Jasmin Young is a 80 y.o. female who presents to the clinic today for:   HPI     Retina Follow Up           Diagnosis: Wet AMD   Laterality: left eye   Onset: 1 month ago   Severity: mild   Duration: 1 month   Course: stable         Comments   1 month fu OS and Avastin OS Pt states VA OU stable since last visit. Pt denies FOL, floaters, or ocular pain OU.  A1C: 6.2 FWY:OVZC not check at home       Last edited by Demetrios Loll, COA on 03/22/2021  9:05 AM.      Referring physician: Daisy Floro, MD [address] [place],  [place] [address]  HISTORICAL INFORMATION:   Selected notes from the MEDICAL RECORD NUMBER       CURRENT MEDICATIONS: No current outpatient medications on file. (Ophthalmic Drugs)   No current facility-administered medications for this visit. (Ophthalmic Drugs)   Current Outpatient Medications (Other)  Medication Sig   amLODipine (NORVASC) 5 MG tablet Take 5 mg by mouth daily.   Ascorbic Acid (VITAMIN C PO) Take 1 tablet by mouth daily.   aspirin EC 81 MG tablet Take 81 mg by mouth daily.   atorvastatin (LIPITOR) 40 MG tablet    b complex vitamins capsule Take 1 capsule by mouth daily.   BYSTOLIC 5 MG tablet TAKE 1 TABLET BY MOUTH EVERY DAY   calcium carbonate (CALCIUM 600) 600 MG TABS tablet Take 600 mg by mouth daily.   Cholecalciferol (VITAMIN D-3 PO) Take 2,000 Units by mouth daily.   COVID-19 mRNA vaccine, Pfizer, 30 MCG/0.3ML injection INJECT AS DIRECTED   famotidine (PEPCID) 20 MG tablet Take 1 tablet (20 mg total) by mouth 2 (two) times daily.   hydrochlorothiazide (HYDRODIURIL) 25 MG tablet Take 25 mg by mouth daily.   metFORMIN (GLUCOPHAGE-XR) 500 MG 24 hr tablet    Multiple Vitamin (MULTIVITAMIN) tablet Take 1 tablet by mouth daily.   Multiple Vitamins-Minerals (PRESERVISION AREDS 2 PO) Take 2 capsules by mouth  daily.   potassium chloride SA (KLOR-CON M20) 20 MEQ tablet Take 1 tablet (20 mEq total) by mouth daily.   Red Yeast Rice 600 MG TABS Take 2 tablets by mouth daily.    No current facility-administered medications for this visit. (Other)      REVIEW OF SYSTEMS:    ALLERGIES Allergies  Allergen Reactions   Ace Inhibitors Cough   Penicillins Rash    PAST MEDICAL HISTORY Past Medical History:  Diagnosis Date   Chest pressure    Exudative age-related macular degeneration of left eye with active choroidal neovascularization (HCC) 12/19/2019   Hyperlipidemia    Hypertension    Palpitations    Past Surgical History:  Procedure Laterality Date   CESAREAN SECTION     OVARY SURGERY      FAMILY HISTORY Family History  Problem Relation Age of Onset   Dementia Mother    Diabetes Brother     SOCIAL HISTORY Social History   Tobacco Use   Smoking status: Never   Smokeless tobacco: Never  Vaping Use   Vaping Use: Never used  Substance Use Topics   Alcohol use: Never   Drug use: Never  OPHTHALMIC EXAM:  Base Eye Exam     Visual Acuity (ETDRS)       Right Left   Dist [place] 20/25 -2 CF at 3'         Tonometry (Tonopen, 9:09 AM)       Right Left   Pressure 13 13         Pupils       Pupils Dark Light Shape React APD   Right PERRL 5 4 Round Brisk None   Left PERRL 5 4 Round Brisk None         Visual Fields (Counting fingers)       Left Right    Full Full         Extraocular Movement       Right Left    Full Full         Neuro/Psych     Oriented x3: Yes   Mood/Affect: Normal         Dilation     Left eye: 1.0% Mydriacyl, 2.5% Phenylephrine @ 9:09 AM           Slit Lamp and Fundus Exam     External Exam       Right Left   External Normal Normal         Slit Lamp Exam       Right Left   Lids/Lashes Normal Normal   Conjunctiva/Sclera White and quiet White and quiet   Cornea Clear Clear   Anterior Chamber  Deep and quiet Deep and quiet   Iris Round and reactive Round and reactive   Lens Nuclear sclerosis Nuclear sclerosis   Anterior Vitreous Normal Normal         Fundus Exam       Right Left   Posterior Vitreous  Posterior vitreous detachment   Disc  Normal   C/D Ratio  0.4   Macula Normal Geographic atrophy, with pigment into the FAZ, no fluid, no thickening, no hemorrhage, clinically detectable   Vessels  Normal   Periphery  Normal            IMAGING AND PROCEDURES  Imaging and Procedures for 03/22/21  OCT, Retina - OU - Both Eyes       Right Eye Quality was good. Scan locations included subfoveal. Central Foveal Thickness: 254. Progression has been stable. Findings include retinal drusen , no SRF.   Left Eye Quality was good. Scan locations included subfoveal. Central Foveal Thickness: 228. Progression has improved. Findings include abnormal foveal contour, retinal drusen , subretinal hyper-reflective material.   Notes Much improved macular anatomy left eye with much less intraretinal fluid, CME and subretinal fluid.  In fact only subretinal hyper reflective material in the foveal location remain, with scarring centrally accounting for acuity with atrophy       Intravitreal Injection, Pharmacologic Agent - OS - Left Eye       Time Out 03/22/2021. 9:30 AM. Confirmed correct patient, procedure, site, and patient consented.   Anesthesia Topical anesthesia was used. Anesthetic medications included Akten 3.5%.   Procedure Preparation included 5% betadine to ocular surface, 10% betadine to eyelids, Tobramycin 0.3%. A 30 gauge needle was used.   Injection: 2.5 mg bevacizumab 2.5 MG/0.1ML   Route: Intravitreal, Site: Left Eye   NDC: [phone], Lot: [id number]   Post-op Post injection exam found visual acuity of at least counting fingers. The patient tolerated the procedure well. There were no complications. The patient received written and verbal  post procedure  care education. Post injection medications were not given.              ASSESSMENT/PLAN:  Exudative age-related macular degeneration of left eye with active choroidal neovascularization (HCC) Macular anatomy OS vastly improved with no serous detachment remaining as compared to March, currently at 5-week interval post Avastin.  We will repeat injection today and examination next in 6-week.  Acuity limited by permanent subfoveal scarring as well as atrophic ARMD  Advanced nonexudative age-related macular degeneration of left eye with subfoveal involvement Accounts for some component of acuity loss left eye     ICD-10-CM   1. Exudative age-related macular degeneration of left eye with active choroidal neovascularization (HCC)  H35.3221 OCT, Retina - OU - Both Eyes    Intravitreal Injection, Pharmacologic Agent - OS - Left Eye    bevacizumab (AVASTIN) SOSY 2.5 mg    2. Advanced nonexudative age-related macular degeneration of left eye with subfoveal involvement  H35.3124       1.  OS vastly improved macular anatomy on intravitreal Avastin at 5-week interval as compared to recurrent onset of CNVM noted March 2022.  We will repeat injection today to maintain stability of findings and confirmation of improved macular anatomy by OCT, by repeat injection today and follow-up next in 6 weeks  2.  3.  Ophthalmic Meds Ordered this visit:  Meds ordered this encounter  Medications   bevacizumab (AVASTIN) SOSY 2.5 mg       Return in about 6 weeks (around 05/03/2021) for dilate, OS, AVASTIN OCT.  There are no Patient Instructions on file for this visit.   Explained the diagnoses, plan, and follow up with the patient and they expressed understanding.  Patient expressed understanding of the importance of proper follow up care.   Alford Highland Avonell Lenig M.D. Diseases & Surgery of the Retina and Vitreous [facility] 03/22/21     Abbreviations: M myopia (nearsighted); A  astigmatism; H hyperopia (farsighted); P presbyopia; Mrx spectacle prescription;  CTL contact lenses; OD right eye; OS left eye; OU both eyes  XT exotropia; ET esotropia; PEK punctate epithelial keratitis; PEE punctate epithelial erosions; DES dry eye syndrome; MGD meibomian gland dysfunction; ATs artificial tears; PFAT's preservative free artificial tears; NSC nuclear sclerotic cataract; PSC posterior subcapsular cataract; ERM epi-retinal membrane; PVD posterior vitreous detachment; RD retinal detachment; DM diabetes mellitus; DR diabetic retinopathy; NPDR non-proliferative diabetic retinopathy; PDR proliferative diabetic retinopathy; CSME clinically significant macular edema; DME diabetic macular edema; dbh dot blot hemorrhages; CWS cotton wool spot; POAG primary open angle glaucoma; C/D cup-to-disc ratio; HVF humphrey visual field; GVF goldmann visual field; OCT optical coherence tomography; IOP intraocular pressure; BRVO Branch retinal vein occlusion; CRVO central retinal vein occlusion; CRAO central retinal artery occlusion; BRAO branch retinal artery occlusion; RT retinal tear; SB scleral buckle; PPV pars plana vitrectomy; VH Vitreous hemorrhage; PRP panretinal laser photocoagulation; IVK intravitreal kenalog; VMT vitreomacular traction; MH Macular hole;  NVD neovascularization of the disc; NVE neovascularization elsewhere; AREDS age related eye disease study; ARMD age related macular degeneration; POAG primary open angle glaucoma; EBMD epithelial/anterior basement membrane dystrophy; ACIOL anterior chamber intraocular lens; IOL intraocular lens; PCIOL posterior chamber intraocular lens; Phaco/IOL phacoemulsification with intraocular lens placement; PRK photorefractive keratectomy; LASIK laser assisted in situ keratomileusis; HTN hypertension; DM diabetes mellitus; COPD chronic obstructive pulmonary disease

## 2021-03-22 NOTE — Assessment & Plan Note (Signed)
Macular anatomy OS vastly improved with no serous detachment remaining as compared to March, currently at 5-week interval post Avastin.  We will repeat injection today and examination next in 6-week.  Acuity limited by permanent subfoveal scarring as well as atrophic ARMD

## 2021-03-31 DIAGNOSIS — R0789 Other chest pain: Secondary | ICD-10-CM | POA: Insufficient documentation

## 2021-03-31 DIAGNOSIS — R002 Palpitations: Secondary | ICD-10-CM | POA: Insufficient documentation

## 2021-04-06 ENCOUNTER — Other Ambulatory Visit: Payer: Self-pay

## 2021-04-06 ENCOUNTER — Encounter: Payer: Self-pay | Admitting: Cardiology

## 2021-04-06 ENCOUNTER — Ambulatory Visit: Payer: Medicare HMO | Admitting: Cardiology

## 2021-04-06 VITALS — BP 124/74 | HR 68 | Ht 63.0 in | Wt 139.0 lb

## 2021-04-06 DIAGNOSIS — R0789 Other chest pain: Secondary | ICD-10-CM

## 2021-04-06 DIAGNOSIS — I10 Essential (primary) hypertension: Secondary | ICD-10-CM

## 2021-04-06 DIAGNOSIS — E785 Hyperlipidemia, unspecified: Secondary | ICD-10-CM

## 2021-04-06 NOTE — Patient Instructions (Addendum)
Medication Instructions:  Your physician recommends that you continue on your current medications as directed. Please refer to the Current Medication list given to you today.  *If you need a refill on your cardiac medications before your next appointment, please call your pharmacy*   Lab Work: None If you have labs (blood work) drawn today and your tests are completely normal, you will receive your results only by: MyChart Message (if you have MyChart) OR A paper copy in the mail If you have any lab test that is abnormal or we need to change your treatment, we will call you to review the results.   Testing/Procedures: None   Follow-Up: At [facility], you and your health needs are our priority.  As part of our continuing mission to provide you with exceptional heart care, we have created designated Provider Care Teams.  These Care Teams include your primary Cardiologist (physician) and Advanced Practice Providers (APPs -  Physician Assistants and Nurse Practitioners) who all work together to provide you with the care you need, when you need it.  We recommend signing up for the patient portal called "MyChart".  Sign up information is provided on this After Visit Summary.  MyChart is used to connect with patients for Virtual Visits (Telemedicine).  Patients are able to view lab/test results, encounter notes, upcoming appointments, etc.  Non-urgent messages can be sent to your provider as well.   To learn more about what you can do with MyChart, go to [email or web address].    Your next appointment:   1 year(s)  The format for your next appointment:   In Person  Provider:   Norman Herrlich, MD   Other Instructions  Healthbeat  Tips to measure your blood pressure correctly  To determine whether you have hypertension, a medical professional will take a blood pressure reading. How you prepare for the test, the position of your arm, and other factors can change a blood pressure  reading by 10% or more. That could be enough to hide high blood pressure, start you on a drug you don't really need, or lead your doctor to incorrectly adjust your medications. National and international guidelines offer specific instructions for measuring blood pressure. If a doctor, nurse, or medical assistant isn't doing it right, don't hesitate to ask him or her to get with the guidelines. Here's what you can do to ensure a correct reading:  Don't drink a caffeinated beverage or smoke during the 30 minutes before the test.  Sit quietly for five minutes before the test begins.  During the measurement, sit in a chair with your feet on the floor and your arm supported so your elbow is at about heart level.  The inflatable part of the cuff should completely cover at least 80% of your upper arm, and the cuff should be placed on bare skin, not over a shirt.  Don't talk during the measurement.  Have your blood pressure measured twice, with a brief break in between. If the readings are different by 5 points or more, have it done a third time. There are times to break these rules. If you sometimes feel lightheaded when getting out of bed in the morning or when you stand after sitting, you should have your blood pressure checked while seated and then while standing to see if it falls from one position to the next. Because blood pressure varies throughout the day, your doctor will rarely diagnose hypertension on the basis of a single reading. Instead, he or she  will want to confirm the measurements on at least two occasions, usually within a few weeks of one another. The exception to this rule is if you have a blood pressure reading of 180/110 mm Hg or higher. A result this high usually calls for prompt treatment. It's also a good idea to have your blood pressure measured in both arms at least once, since the reading in one arm (usually the right) may be higher than that in the left. A 2014 study in The American  Journal of Medicine of nearly 3,400 people found average arm- to-arm differences in systolic blood pressure of about 5 points. The higher number should be used to make treatment decisions. In 2017, new guidelines from the [place], the [employer] of Cardiology, and nine other health organizations lowered the diagnosis of high blood pressure to 130/80 mm Hg or higher for all adults. The guidelines also redefined the various blood pressure categories to now include normal, elevated, Stage 1 hypertension, Stage 2 hypertension, and hypertensive crisis (see "Blood pressure categories"). Blood pressure categories  Blood pressure category SYSTOLIC (upper number)  DIASTOLIC (lower number)  Normal Less than 120 mm Hg and Less than 80 mm Hg  Elevated 120-129 mm Hg and Less than 80 mm Hg  High blood pressure: Stage 1 hypertension 130-139 mm Hg or 80-89 mm Hg  High blood pressure: Stage 2 hypertension 140 mm Hg or higher or 90 mm Hg or higher  Hypertensive crisis (consult your doctor immediately) Higher than 180 mm Hg and/or Higher than 120 mm Hg  Source: American Heart Association and American Stroke Association. For more on getting your blood pressure under control, buy Controlling Your Blood Pressure, a Special Health Report from [facility].

## 2021-04-06 NOTE — Progress Notes (Signed)
Cardiology Office Note:    Date:  04/06/2021   ID:  Jasmin Young, DOB 1941/05/12, MRN [id number]  PCP:  Daisy Floro, MD  Cardiologist:  Norman Herrlich, MD    Referring MD: Daisy Floro, MD    ASSESSMENT:    1. Chest pressure   2. Essential hypertension   3. Hyperlipidemia, unspecified hyperlipidemia type    PLAN:    In order of problems listed above:  Patient experienced an episode of lower sternal chest pain back in May of 2022. She was seen in the ED and EKG and troponin were normal at that time. Her symptoms resolved with a GI cocktail, so it was presumed GERD. She reports no further episodes of chest pain, no dyspnea, palpitations or syncope. I agree this is most likely GERD and am reassured that it has not recurred. Do not feel additional workup is warranted at this time. Return precautions discussed. Initial BP elevated, but when I rechecked her blood pressure manually it was 124/80. Continue current regimen which includes Amlodipine 5mg  daily and HCTZ 25mg  daily. States her BP is often elevated to 150 systolic at home, but she checks in the morning before taking her medication. Patient will check BP at home and reach out with any concerns or persistently elevated readings. Lipids reviewed and are at goal. Continue Atorvastatin 40mg  daily.  Next appointment: 1 year   Medication Adjustments/Labs and Tests Ordered: Current medicines are reviewed at length with the patient today.  Concerns regarding medicines are outlined above.  No orders of the defined types were placed in this encounter.  No orders of the defined types were placed in this encounter.   Chief Complaint  Patient presents with   Chest Pain    History of Present Illness:    Jasmin Young is a 80 y.o. female with a hx of palpitation with symptomatic APCs hypertension and hyperlipidemia last seen 06/19/2018.  Compliance with diet, lifestyle and medications: Yes  She was seen in the emergency  department [facility] 12/22/2020 with intermittent chest pain.  High-sensitivity troponin initial and repeat were normal chest x-ray was normal EKG showed sinus rhythm borderline left axis deviation no acute ischemic changes his serum potassium was mildly diminished 3.4 remainder of his CBC and BMP was normal.  Symptoms were felt to be possibly GI in etiology she was continued on H2 blocker provided recommendations for dietary modification for acid reflux.  She had an echocardiogram 07/06/2018 she had moderate concentric LVH normal ventricular size systolic function was normal EF 55 to 60% she had normal left ventricular filling pressures right ventricle is normal in size function and pulmonary artery systolic pressure there is no significant valvular abnormality.  She has never had an ischemia evaluation.  Patient reports the chest pain she experienced in May resolved completely with medications for GERD. She has not had any recurrences of the pain. Denies dyspnea, palpitations, syncope or other concerns. Walks 3 miles daily and overall feels well.  Past Medical History:  Diagnosis Date   Chest pressure    Exudative age-related macular degeneration of left eye with active choroidal neovascularization (HCC) 12/19/2019   Hyperlipidemia    Hypertension    Palpitations     Past Surgical History:  Procedure Laterality Date   CESAREAN SECTION     OVARY SURGERY      Current Medications: Current Meds  Medication Sig   amLODipine (NORVASC) 5 MG tablet Take 5 mg by mouth daily.  Ascorbic Acid (VITAMIN C PO) Take 1 tablet by mouth daily.   aspirin EC 81 MG tablet Take 81 mg by mouth daily.   atorvastatin (LIPITOR) 40 MG tablet    b complex vitamins capsule Take 1 capsule by mouth daily.   BYSTOLIC 5 MG tablet TAKE 1 TABLET BY MOUTH EVERY DAY   calcium carbonate (OS-CAL) 600 MG TABS tablet Take 600 mg by mouth daily.   Cholecalciferol (VITAMIN D-3 PO) Take 2,000 Units by mouth daily.    COVID-19 mRNA vaccine, Pfizer, 30 MCG/0.3ML injection INJECT AS DIRECTED   hydrochlorothiazide (HYDRODIURIL) 25 MG tablet Take 25 mg by mouth daily.   metFORMIN (GLUCOPHAGE-XR) 500 MG 24 hr tablet    Multiple Vitamin (MULTIVITAMIN) tablet Take 1 tablet by mouth daily.   Multiple Vitamins-Minerals (PRESERVISION AREDS 2 PO) Take 2 capsules by mouth daily.   potassium chloride SA (KLOR-CON M20) 20 MEQ tablet Take 1 tablet (20 mEq total) by mouth daily.     Allergies:   Ace inhibitors and Penicillins   Social History   Socioeconomic History   Marital status: Married    Spouse name: Not on file   Number of children: Not on file   Years of education: Not on file   Highest education level: Not on file  Occupational History   Not on file  Tobacco Use   Smoking status: Never   Smokeless tobacco: Never  Vaping Use   Vaping Use: Never used  Substance and Sexual Activity   Alcohol use: Never   Drug use: Never   Sexual activity: Not on file  Other Topics Concern   Not on file  Social History Narrative   Not on file   Social Determinants of Health   Financial Resource Strain: Not on file  Food Insecurity: Not on file  Transportation Needs: Not on file  Physical Activity: Not on file  Stress: Not on file  Social Connections: Not on file     Family History: The patient's family history includes Dementia in her mother; Diabetes in her brother. ROS:   Please see the history of present illness.    All other systems reviewed and are negative.  EKGs/Labs/Other Studies Reviewed:    The following studies were reviewed today:  EKG (12/22/2020): normal sinus rhythm   Recent Labs: 12/22/2020: BUN 16; Creatinine, Ser 0.61; Hemoglobin 13.9; Platelets 191; Potassium 3.4; Sodium 138  Recent Lipid Panel No results found for: CHOL, TRIG, HDL, CHOLHDL, VLDL, LDLCALC, LDLDIRECT  Physical Exam:    VS:  BP 124/74 (BP Location: Right Arm, Patient Position: Sitting, Cuff Size: Normal)    Pulse 68   Ht 5\' 3"  (1.6 m)   Wt 139 lb 0.6 oz (63.1 kg)   SpO2 99%   BMI 24.63 kg/m     Wt Readings from Last 3 Encounters:  04/06/21 139 lb 0.6 oz (63.1 kg)  12/22/20 139 lb (63 kg)  01/15/19 132 lb (59.9 kg)     GEN: Well nourished, well developed in no acute distress HEENT: Normal NECK: No JVD; No carotid bruits LYMPHATICS: No lymphadenopathy CARDIAC: RRR, no murmurs, rubs, gallops RESPIRATORY:  Clear to auscultation without rales, wheezing or rhonchi  ABDOMEN: Soft, non-tender, non-distended MUSCULOSKELETAL:  No edema; No deformity  SKIN: Warm and dry NEUROLOGIC:  Alert and oriented x 3 PSYCHIATRIC:  Normal affect    Signed, 03/17/19, MD  04/06/2021 3:53 PM    [facility]

## 2021-04-28 DIAGNOSIS — E782 Mixed hyperlipidemia: Secondary | ICD-10-CM | POA: Diagnosis not present

## 2021-04-28 DIAGNOSIS — M1990 Unspecified osteoarthritis, unspecified site: Secondary | ICD-10-CM | POA: Diagnosis not present

## 2021-04-28 DIAGNOSIS — M8580 Other specified disorders of bone density and structure, unspecified site: Secondary | ICD-10-CM | POA: Diagnosis not present

## 2021-04-28 DIAGNOSIS — I10 Essential (primary) hypertension: Secondary | ICD-10-CM | POA: Diagnosis not present

## 2021-05-03 ENCOUNTER — Ambulatory Visit (INDEPENDENT_AMBULATORY_CARE_PROVIDER_SITE_OTHER): Payer: Medicare HMO | Admitting: Ophthalmology

## 2021-05-03 ENCOUNTER — Encounter (INDEPENDENT_AMBULATORY_CARE_PROVIDER_SITE_OTHER): Payer: Self-pay | Admitting: Ophthalmology

## 2021-05-03 ENCOUNTER — Other Ambulatory Visit: Payer: Self-pay

## 2021-05-03 DIAGNOSIS — H353112 Nonexudative age-related macular degeneration, right eye, intermediate dry stage: Secondary | ICD-10-CM

## 2021-05-03 DIAGNOSIS — H353221 Exudative age-related macular degeneration, left eye, with active choroidal neovascularization: Secondary | ICD-10-CM | POA: Diagnosis not present

## 2021-05-03 MED ORDER — BEVACIZUMAB 2.5 MG/0.1ML IZ SOSY
2.5000 mg | PREFILLED_SYRINGE | INTRAVITREAL | Status: AC | PRN
  Administered 2021-05-03: 2.5 mg via INTRAVITREAL

## 2021-05-03 NOTE — Progress Notes (Signed)
05/03/2021     CHIEF COMPLAINT Patient presents for  Chief Complaint  Patient presents with   Retina Follow Up      HISTORY OF PRESENT ILLNESS: Jasmin Young is a 80 y.o. female who presents to the clinic today for:   HPI     Retina Follow Up   Patient presents with  Wet AMD.  In left eye.  This started 6 weeks ago.  Duration of 6 weeks.        Comments   6 week f/u OS with OCT and possible Avastin injection  Pt c/o occasional eye pain, mostly in the left, once in the right. Pt describes pain as a poking sensation. States the pain typically occurs following the injections, doesn't last long. Pt denies any visual changes since previous visit. Pt denies any new flashes or floaters.   Eye Meds: None      Last edited by Frederik Pear, COA on 05/03/2021  9:32 AM.      Referring physician: Daisy Floro, MD [address] [place],  [place] [address]  HISTORICAL INFORMATION:   Selected notes from the MEDICAL RECORD NUMBER       CURRENT MEDICATIONS: No current outpatient medications on file. (Ophthalmic Drugs)   No current facility-administered medications for this visit. (Ophthalmic Drugs)   Current Outpatient Medications (Other)  Medication Sig   amLODipine (NORVASC) 5 MG tablet Take 5 mg by mouth daily.   Ascorbic Acid (VITAMIN C PO) Take 1 tablet by mouth daily.   aspirin EC 81 MG tablet Take 81 mg by mouth daily.   atorvastatin (LIPITOR) 40 MG tablet    b complex vitamins capsule Take 1 capsule by mouth daily.   BYSTOLIC 5 MG tablet TAKE 1 TABLET BY MOUTH EVERY DAY   calcium carbonate (OS-CAL) 600 MG TABS tablet Take 600 mg by mouth daily.   Cholecalciferol (VITAMIN D-3 PO) Take 2,000 Units by mouth daily.   COVID-19 mRNA vaccine, Pfizer, 30 MCG/0.3ML injection INJECT AS DIRECTED   hydrochlorothiazide (HYDRODIURIL) 25 MG tablet Take 25 mg by mouth daily.   metFORMIN (GLUCOPHAGE-XR) 500 MG 24 hr tablet    Multiple Vitamin (MULTIVITAMIN) tablet  Take 1 tablet by mouth daily.   Multiple Vitamins-Minerals (PRESERVISION AREDS 2 PO) Take 2 capsules by mouth daily.   potassium chloride SA (KLOR-CON M20) 20 MEQ tablet Take 1 tablet (20 mEq total) by mouth daily.   No current facility-administered medications for this visit. (Other)      REVIEW OF SYSTEMS:    ALLERGIES Allergies  Allergen Reactions   Ace Inhibitors Cough   Penicillins Rash    PAST MEDICAL HISTORY Past Medical History:  Diagnosis Date   Chest pressure    Exudative age-related macular degeneration of left eye with active choroidal neovascularization (HCC) 12/19/2019   Hyperlipidemia    Hypertension    Palpitations    Past Surgical History:  Procedure Laterality Date   CESAREAN SECTION     OVARY SURGERY      FAMILY HISTORY Family History  Problem Relation Age of Onset   Dementia Mother    Diabetes Brother     SOCIAL HISTORY Social History   Tobacco Use   Smoking status: Never   Smokeless tobacco: Never  Vaping Use   Vaping Use: Never used  Substance Use Topics   Alcohol use: Never   Drug use: Never         OPHTHALMIC EXAM:  Base Eye Exam  Visual Acuity (ETDRS)       Right Left   Dist cc 20/25 -1 CF@3ft     Correction: Glasses         Tonometry (Tonopen, 9:38 AM)       Right Left   Pressure 9 12         Pupils       Dark Light Shape React APD   Right 5 4 Round Brisk None   Left 5 4 Round Brisk Trace         Visual Fields (Counting fingers)       Left Right    Full Full         Extraocular Movement       Right Left    Full, Ortho Full, Ortho         Neuro/Psych     Oriented x3: Yes   Mood/Affect: Normal         Dilation     Both eyes: 1.0% Mydriacyl, 2.5% Phenylephrine @ 9:38 AM           Slit Lamp and Fundus Exam     External Exam       Right Left   External Normal Normal         Slit Lamp Exam       Right Left   Lids/Lashes Normal Normal   Conjunctiva/Sclera White  and quiet White and quiet   Cornea Clear Clear   Anterior Chamber Deep and quiet Deep and quiet   Iris Round and reactive Round and reactive   Lens Nuclear sclerosis Nuclear sclerosis   Anterior Vitreous Normal Normal         Fundus Exam       Right Left   Posterior Vitreous  Posterior vitreous detachment   Disc  Normal   C/D Ratio  0.4   Macula Normal Geographic atrophy, with pigment into the FAZ, no fluid, no thickening, no hemorrhage, clinically detectable   Vessels  Normal   Periphery  Normal            IMAGING AND PROCEDURES  Imaging and Procedures for 05/03/21  OCT, Retina - OU - Both Eyes       Right Eye Quality was good. Scan locations included subfoveal. Central Foveal Thickness: 251. Progression has been stable. Findings include retinal drusen , no SRF.   Left Eye Quality was good. Scan locations included subfoveal. Central Foveal Thickness: 205. Progression has improved. Findings include abnormal foveal contour, retinal drusen , subretinal hyper-reflective material.   Notes Much improved macular anatomy left eye with much less intraretinal fluid, CME and subretinal fluid.  In fact only subretinal hyper reflective material in the foveal location remain, with scarring centrally accounting for acuity with atrophy, at 6-week interval       Intravitreal Injection, Pharmacologic Agent - OS - Left Eye       Time Out 05/03/2021. 10:01 AM. Confirmed correct patient, procedure, site, and patient consented.   Anesthesia Topical anesthesia was used. Anesthetic medications included Akten 3.5%.   Procedure Preparation included 5% betadine to ocular surface, 10% betadine to eyelids, Tobramycin 0.3%. A 30 gauge needle was used.   Injection: 2.5 mg bevacizumab 2.5 MG/0.1ML   Route: Intravitreal, Site: Left Eye   NDC: [phone], Lot: [id number]   Post-op Post injection exam found visual acuity of at least counting fingers. The patient tolerated the procedure  well. There were no complications. The patient received written and verbal post procedure care education.  Post injection medications were not given.              ASSESSMENT/PLAN:  Intermediate stage nonexudative age-related macular degeneration of right eye OD stable by acuity and OCT findings today  Exudative age-related macular degeneration of left eye with active choroidal neovascularization (HCC) Vastly improved over the last 6 months post institution of therapy for recurrence of CNVM large CNVM subfoveal, today at 6-week interval.  Repeat injection today and next evaluation in 7 weeks     ICD-10-CM   1. Exudative age-related macular degeneration of left eye with active choroidal neovascularization (HCC)  H35.3221 OCT, Retina - OU - Both Eyes    Intravitreal Injection, Pharmacologic Agent - OS - Left Eye    bevacizumab (AVASTIN) SOSY 2.5 mg    2. Intermediate stage nonexudative age-related macular degeneration of right eye  H35.3112       1.  OS vastly improved macular anatomy with stabilize acuity and noted growth of scotoma.  Post injection of Avastin 6 weeks prior.  Improved as compared to recurrence of lesion subfoveal March 2022.  Repeat injection today  2.  Dilate OS follow-up in 7 weeks  3.  Ophthalmic Meds Ordered this visit:  Meds ordered this encounter  Medications   bevacizumab (AVASTIN) SOSY 2.5 mg       Return in about 7 weeks (around 06/21/2021) for dilate, OS, AVASTIN OCT.  There are no Patient Instructions on file for this visit.   Explained the diagnoses, plan, and follow up with the patient and they expressed understanding.  Patient expressed understanding of the importance of proper follow up care.   Alford Highland Sreekar Broyhill M.D. Diseases & Surgery of the Retina and Vitreous [facility] 05/03/21     Abbreviations: M myopia (nearsighted); A astigmatism; H hyperopia (farsighted); P presbyopia; Mrx spectacle prescription;  CTL contact  lenses; OD right eye; OS left eye; OU both eyes  XT exotropia; ET esotropia; PEK punctate epithelial keratitis; PEE punctate epithelial erosions; DES dry eye syndrome; MGD meibomian gland dysfunction; ATs artificial tears; PFAT's preservative free artificial tears; NSC nuclear sclerotic cataract; PSC posterior subcapsular cataract; ERM epi-retinal membrane; PVD posterior vitreous detachment; RD retinal detachment; DM diabetes mellitus; DR diabetic retinopathy; NPDR non-proliferative diabetic retinopathy; PDR proliferative diabetic retinopathy; CSME clinically significant macular edema; DME diabetic macular edema; dbh dot blot hemorrhages; CWS cotton wool spot; POAG primary open angle glaucoma; C/D cup-to-disc ratio; HVF humphrey visual field; GVF goldmann visual field; OCT optical coherence tomography; IOP intraocular pressure; BRVO Branch retinal vein occlusion; CRVO central retinal vein occlusion; CRAO central retinal artery occlusion; BRAO branch retinal artery occlusion; RT retinal tear; SB scleral buckle; PPV pars plana vitrectomy; VH Vitreous hemorrhage; PRP panretinal laser photocoagulation; IVK intravitreal kenalog; VMT vitreomacular traction; MH Macular hole;  NVD neovascularization of the disc; NVE neovascularization elsewhere; AREDS age related eye disease study; ARMD age related macular degeneration; POAG primary open angle glaucoma; EBMD epithelial/anterior basement membrane dystrophy; ACIOL anterior chamber intraocular lens; IOL intraocular lens; PCIOL posterior chamber intraocular lens; Phaco/IOL phacoemulsification with intraocular lens placement; PRK photorefractive keratectomy; LASIK laser assisted in situ keratomileusis; HTN hypertension; DM diabetes mellitus; COPD chronic obstructive pulmonary disease

## 2021-05-03 NOTE — Assessment & Plan Note (Signed)
OD stable by acuity and OCT findings today

## 2021-05-03 NOTE — Assessment & Plan Note (Signed)
Vastly improved over the last 6 months post institution of therapy for recurrence of CNVM large CNVM subfoveal, today at 6-week interval.  Repeat injection today and next evaluation in 7 weeks

## 2021-06-21 ENCOUNTER — Ambulatory Visit (INDEPENDENT_AMBULATORY_CARE_PROVIDER_SITE_OTHER): Payer: Medicare HMO | Admitting: Ophthalmology

## 2021-06-21 ENCOUNTER — Other Ambulatory Visit: Payer: Self-pay

## 2021-06-21 ENCOUNTER — Encounter (INDEPENDENT_AMBULATORY_CARE_PROVIDER_SITE_OTHER): Payer: Self-pay | Admitting: Ophthalmology

## 2021-06-21 DIAGNOSIS — H353221 Exudative age-related macular degeneration, left eye, with active choroidal neovascularization: Secondary | ICD-10-CM

## 2021-06-21 DIAGNOSIS — H353112 Nonexudative age-related macular degeneration, right eye, intermediate dry stage: Secondary | ICD-10-CM

## 2021-06-21 DIAGNOSIS — H353124 Nonexudative age-related macular degeneration, left eye, advanced atrophic with subfoveal involvement: Secondary | ICD-10-CM | POA: Diagnosis not present

## 2021-06-21 MED ORDER — BEVACIZUMAB 2.5 MG/0.1ML IZ SOSY
2.5000 mg | PREFILLED_SYRINGE | INTRAVITREAL | Status: AC | PRN
  Administered 2021-06-21: 2.5 mg via INTRAVITREAL

## 2021-06-21 NOTE — Assessment & Plan Note (Signed)
Atrophy in the FAZ residual of prior CNVM, now counts are acuity

## 2021-06-21 NOTE — Progress Notes (Signed)
06/21/2021     CHIEF COMPLAINT Patient presents for  Chief Complaint  Patient presents with   Retina Follow Up      HISTORY OF PRESENT ILLNESS: Jasmin Young is a 80 y.o. female who presents to the clinic today for:   HPI     Retina Follow Up   Patient presents with  Wet AMD.  In left eye.  This started 7 weeks ago.  Duration of 7 weeks.  Since onset it is stable.        Comments   7 week f/u OS with OCT and possible Avastin injection OS      Last edited by Frederik Pear, COA on 06/21/2021  9:40 AM.      Referring physician: Daisy Floro, MD [address] [place],  [place] [address]  HISTORICAL INFORMATION:   Selected notes from the MEDICAL RECORD NUMBER       CURRENT MEDICATIONS: No current outpatient medications on file. (Ophthalmic Drugs)   No current facility-administered medications for this visit. (Ophthalmic Drugs)   Current Outpatient Medications (Other)  Medication Sig   amLODipine (NORVASC) 5 MG tablet Take 5 mg by mouth daily.   Ascorbic Acid (VITAMIN C PO) Take 1 tablet by mouth daily.   aspirin EC 81 MG tablet Take 81 mg by mouth daily.   atorvastatin (LIPITOR) 40 MG tablet    b complex vitamins capsule Take 1 capsule by mouth daily.   BYSTOLIC 5 MG tablet TAKE 1 TABLET BY MOUTH EVERY DAY   calcium carbonate (OS-CAL) 600 MG TABS tablet Take 600 mg by mouth daily.   Cholecalciferol (VITAMIN D-3 PO) Take 2,000 Units by mouth daily.   hydrochlorothiazide (HYDRODIURIL) 25 MG tablet Take 25 mg by mouth daily.   metFORMIN (GLUCOPHAGE-XR) 500 MG 24 hr tablet    Multiple Vitamin (MULTIVITAMIN) tablet Take 1 tablet by mouth daily.   Multiple Vitamins-Minerals (PRESERVISION AREDS 2 PO) Take 2 capsules by mouth daily.   potassium chloride SA (KLOR-CON M20) 20 MEQ tablet Take 1 tablet (20 mEq total) by mouth daily.   No current facility-administered medications for this visit. (Other)      REVIEW OF SYSTEMS:    ALLERGIES Allergies   Allergen Reactions   Ace Inhibitors Cough   Penicillins Rash    PAST MEDICAL HISTORY Past Medical History:  Diagnosis Date   Chest pressure    Exudative age-related macular degeneration of left eye with active choroidal neovascularization (HCC) 12/19/2019   Hyperlipidemia    Hypertension    Palpitations    Past Surgical History:  Procedure Laterality Date   CESAREAN SECTION     OVARY SURGERY      FAMILY HISTORY Family History  Problem Relation Age of Onset   Dementia Mother    Diabetes Brother     SOCIAL HISTORY Social History   Tobacco Use   Smoking status: Never   Smokeless tobacco: Never  Vaping Use   Vaping Use: Never used  Substance Use Topics   Alcohol use: Never   Drug use: Never         OPHTHALMIC EXAM:  Base Eye Exam     Visual Acuity (ETDRS)       Right Left   Dist cc 20/25 -1 CF@3ft    Dist ph cc  NI    Correction: Glasses         Tonometry (Tonopen, 9:44 AM)       Right Left   Pressure 12 12  Pupils       Pupils Dark Light Shape React APD   Right PERRL 5 4 Round Brisk None   Left PERRL 5 4 Round Brisk None         Visual Fields (Counting fingers)       Left Right    Full Full         Extraocular Movement       Right Left    Full, Ortho Full, Ortho         Neuro/Psych     Oriented x3: Yes   Mood/Affect: Normal         Dilation     Left eye: 1.0% Mydriacyl, 2.5% Phenylephrine @ 9:44 AM           Slit Lamp and Fundus Exam     External Exam       Right Left   External Normal Normal         Slit Lamp Exam       Right Left   Lids/Lashes Normal Normal   Conjunctiva/Sclera White and quiet White and quiet   Cornea Clear Clear   Anterior Chamber Deep and quiet Deep and quiet   Iris Round and reactive Round and reactive   Lens Nuclear sclerosis Nuclear sclerosis   Anterior Vitreous Normal Normal         Fundus Exam       Right Left   Posterior Vitreous  Posterior vitreous  detachment   Disc  Normal   C/D Ratio  0.4   Macula Normal Geographic atrophy, with pigment into the FAZ, no fluid, no thickening, no hemorrhage, clinically detectable, Retinal pigment epithelial mottling   Vessels  Normal   Periphery  Normal            IMAGING AND PROCEDURES  Imaging and Procedures for 06/21/21  OCT, Retina - OU - Both Eyes       Right Eye Quality was good. Scan locations included subfoveal. Central Foveal Thickness: 250. Progression has been stable. Findings include retinal drusen , no SRF.   Left Eye Quality was good. Scan locations included subfoveal. Central Foveal Thickness: 198. Progression has improved. Findings include abnormal foveal contour, retinal drusen , subretinal hyper-reflective material.   Notes Much improved macular anatomy left eye with much less intraretinal fluid, CME and subretinal fluid.  In fact only subretinal hyper reflective material in the foveal location remain, with scarring centrally accounting for acuity with atrophy, at 7-week interval       Intravitreal Injection, Pharmacologic Agent - OS - Left Eye       Time Out 06/21/2021. 10:36 AM. Confirmed correct patient, procedure, site, and patient consented.   Anesthesia Topical anesthesia was used. Anesthetic medications included Akten 3.5%.   Procedure Preparation included 5% betadine to ocular surface, 10% betadine to eyelids, Tobramycin 0.3%. A 30 gauge needle was used.   Injection: 2.5 mg bevacizumab 2.5 MG/0.1ML   Route: Intravitreal, Site: Left Eye   NDC: [phone], Lot: [id number]   Post-op Post injection exam found visual acuity of at least counting fingers. The patient tolerated the procedure well. There were no complications. The patient received written and verbal post procedure care education. Post injection medications included ocuflox.              ASSESSMENT/PLAN:  Intermediate stage nonexudative age-related macular degeneration of right eye No  sign of CNVM OD by OCT  Advanced nonexudative age-related macular degeneration of left eye with subfoveal involvement Atrophy  in the FAZ residual of prior CNVM, now counts are acuity  Exudative age-related macular degeneration of left eye with active choroidal neovascularization (HCC) History of recurrence OS some 8 months previous now vastly improved anatomically yet with residual vision loss.  Currently at 7-week interval follow-up. Repeat injection today and examination next in 8 weeks     ICD-10-CM   1. Exudative age-related macular degeneration of left eye with active choroidal neovascularization (HCC)  H35.3221 OCT, Retina - OU - Both Eyes    Intravitreal Injection, Pharmacologic Agent - OS - Left Eye    bevacizumab (AVASTIN) SOSY 2.5 mg    2. Intermediate stage nonexudative age-related macular degeneration of right eye  H35.3112     3. Advanced nonexudative age-related macular degeneration of left eye with subfoveal involvement  H35.3124       1.  OS anatomy of the macula vastly improved as compared to onset of recurrent CNVM March 2022, repeat injection today at 7-week follow-up interval examination next OS in 8 weeks  2.  Dilate OU next possible injection Avastin OS  3.  Ophthalmic Meds Ordered this visit:  Meds ordered this encounter  Medications   bevacizumab (AVASTIN) SOSY 2.5 mg       Return in about 8 weeks (around 08/16/2021) for DILATE OU, AVASTIN OCT, OS.  There are no Patient Instructions on file for this visit.   Explained the diagnoses, plan, and follow up with the patient and they expressed understanding.  Patient expressed understanding of the importance of proper follow up care.   Alford Highland Quantina Dershem M.D. Diseases & Surgery of the Retina and Vitreous [facility] 06/21/21     Abbreviations: M myopia (nearsighted); A astigmatism; H hyperopia (farsighted); P presbyopia; Mrx spectacle prescription;  CTL contact lenses; OD right eye; OS  left eye; OU both eyes  XT exotropia; ET esotropia; PEK punctate epithelial keratitis; PEE punctate epithelial erosions; DES dry eye syndrome; MGD meibomian gland dysfunction; ATs artificial tears; PFAT's preservative free artificial tears; NSC nuclear sclerotic cataract; PSC posterior subcapsular cataract; ERM epi-retinal membrane; PVD posterior vitreous detachment; RD retinal detachment; DM diabetes mellitus; DR diabetic retinopathy; NPDR non-proliferative diabetic retinopathy; PDR proliferative diabetic retinopathy; CSME clinically significant macular edema; DME diabetic macular edema; dbh dot blot hemorrhages; CWS cotton wool spot; POAG primary open angle glaucoma; C/D cup-to-disc ratio; HVF humphrey visual field; GVF goldmann visual field; OCT optical coherence tomography; IOP intraocular pressure; BRVO Branch retinal vein occlusion; CRVO central retinal vein occlusion; CRAO central retinal artery occlusion; BRAO branch retinal artery occlusion; RT retinal tear; SB scleral buckle; PPV pars plana vitrectomy; VH Vitreous hemorrhage; PRP panretinal laser photocoagulation; IVK intravitreal kenalog; VMT vitreomacular traction; MH Macular hole;  NVD neovascularization of the disc; NVE neovascularization elsewhere; AREDS age related eye disease study; ARMD age related macular degeneration; POAG primary open angle glaucoma; EBMD epithelial/anterior basement membrane dystrophy; ACIOL anterior chamber intraocular lens; IOL intraocular lens; PCIOL posterior chamber intraocular lens; Phaco/IOL phacoemulsification with intraocular lens placement; PRK photorefractive keratectomy; LASIK laser assisted in situ keratomileusis; HTN hypertension; DM diabetes mellitus; COPD chronic obstructive pulmonary disease

## 2021-06-21 NOTE — Assessment & Plan Note (Signed)
No sign of CNVM OD by OCT 

## 2021-06-21 NOTE — Assessment & Plan Note (Signed)
History of recurrence OS some 8 months previous now vastly improved anatomically yet with residual vision loss.  Currently at 7-week interval follow-up. Repeat injection today and examination next in 8 weeks

## 2021-08-16 ENCOUNTER — Ambulatory Visit (INDEPENDENT_AMBULATORY_CARE_PROVIDER_SITE_OTHER): Payer: Medicare HMO | Admitting: Ophthalmology

## 2021-08-16 ENCOUNTER — Other Ambulatory Visit: Payer: Self-pay

## 2021-08-16 ENCOUNTER — Encounter (INDEPENDENT_AMBULATORY_CARE_PROVIDER_SITE_OTHER): Payer: Self-pay | Admitting: Ophthalmology

## 2021-08-16 DIAGNOSIS — H353112 Nonexudative age-related macular degeneration, right eye, intermediate dry stage: Secondary | ICD-10-CM | POA: Diagnosis not present

## 2021-08-16 DIAGNOSIS — H43391 Other vitreous opacities, right eye: Secondary | ICD-10-CM

## 2021-08-16 DIAGNOSIS — H353221 Exudative age-related macular degeneration, left eye, with active choroidal neovascularization: Secondary | ICD-10-CM

## 2021-08-16 MED ORDER — BEVACIZUMAB 2.5 MG/0.1ML IZ SOSY
2.5000 mg | PREFILLED_SYRINGE | INTRAVITREAL | Status: AC | PRN
  Administered 2021-08-16: 2.5 mg via INTRAVITREAL

## 2021-08-16 NOTE — Assessment & Plan Note (Signed)
Observe OD,

## 2021-08-16 NOTE — Progress Notes (Signed)
08/16/2021     CHIEF COMPLAINT Patient presents for  Chief Complaint  Patient presents with   Retina Follow Up      HISTORY OF PRESENT ILLNESS: Jasmin Young is a 81 y.o. female who presents to the clinic today for:   HPI     Retina Follow Up           Diagnosis: Wet AMD   Laterality: left eye   Onset: 8 weeks ago   Severity: mild   Duration: 8 weeks   Course: stable         Comments   8 weeks fu OU oct avastin OS. Patient states vision is stable and unchanged since last visit. Denies any new floaters or FOL.       Last edited by Laurin Coder on 08/16/2021  9:56 AM.      Referring physician: Lawerance Cruel, MD [place],  [place] [address]  HISTORICAL INFORMATION:   Selected notes from the [facility]: No current outpatient medications on file. (Ophthalmic Drugs)   No current facility-administered medications for this visit. (Ophthalmic Drugs)   Current Outpatient Medications (Other)  Medication Sig   amLODipine (NORVASC) 5 MG tablet Take 5 mg by mouth daily.   Ascorbic Acid (VITAMIN C PO) Take 1 tablet by mouth daily.   aspirin EC 81 MG tablet Take 81 mg by mouth daily.   atorvastatin (LIPITOR) 40 MG tablet    b complex vitamins capsule Take 1 capsule by mouth daily.   BYSTOLIC 5 MG tablet TAKE 1 TABLET BY MOUTH EVERY DAY   calcium carbonate (OS-CAL) 600 MG TABS tablet Take 600 mg by mouth daily.   Cholecalciferol (VITAMIN D-3 PO) Take 2,000 Units by mouth daily.   hydrochlorothiazide (HYDRODIURIL) 25 MG tablet Take 25 mg by mouth daily.   metFORMIN (GLUCOPHAGE-XR) 500 MG 24 hr tablet    Multiple Vitamin (MULTIVITAMIN) tablet Take 1 tablet by mouth daily.   Multiple Vitamins-Minerals (PRESERVISION AREDS 2 PO) Take 2 capsules by mouth daily.   potassium chloride SA (KLOR-CON M20) 20 MEQ tablet Take 1 tablet (20 mEq total) by mouth daily.   No current facility-administered medications for  this visit. (Other)      REVIEW OF SYSTEMS:    ALLERGIES Allergies  Allergen Reactions   Ace Inhibitors Cough   Penicillins Rash    PAST MEDICAL HISTORY Past Medical History:  Diagnosis Date   Chest pressure    Exudative age-related macular degeneration of left eye with active choroidal neovascularization ([facility] [place]) 12/19/2019   Hyperlipidemia    Hypertension    Palpitations    Past Surgical History:  Procedure Laterality Date   CESAREAN SECTION     OVARY SURGERY      FAMILY HISTORY Family History  Problem Relation Age of Onset   Dementia Mother    Diabetes Brother     SOCIAL HISTORY Social History   Tobacco Use   Smoking status: Never   Smokeless tobacco: Never  Vaping Use   Vaping Use: Never used  Substance Use Topics   Alcohol use: Never   Drug use: Never         OPHTHALMIC EXAM:  Base Eye Exam     Visual Acuity (ETDRS)       Right Left   Dist cc 20/30 -2 CF at 3'   Dist ph cc  NI    Correction: Glasses  Tonometry (Tonopen, 9:59 AM)       Right Left   Pressure 21 15         Pupils       Pupils Dark Shape React APD   Right PERRL 6 Round Brisk None   Left PERRL 6 Round Brisk None         Extraocular Movement       Right Left    Full Full         Neuro/Psych     Oriented x3: Yes   Mood/Affect: Normal         Dilation     Left eye: 1.0% Mydriacyl, 2.5% Phenylephrine @ 9:59 AM           Slit Lamp and Fundus Exam     External Exam       Right Left   External Normal Normal         Slit Lamp Exam       Right Left   Lids/Lashes Normal Normal   Conjunctiva/Sclera White and quiet White and quiet   Cornea Clear Clear   Anterior Chamber Deep and quiet Deep and quiet   Iris Round and reactive Round and reactive   Lens Nuclear sclerosis 2+ Nuclear sclerosis   Anterior Vitreous Normal Normal         Fundus Exam       Right Left   Posterior Vitreous  Posterior vitreous detachment   Disc   Normal   C/D Ratio  0.4   Macula Normal Geographic atrophy, with pigment into the FAZ, no fluid, no thickening, no hemorrhage, clinically detectable, Retinal pigment epithelial mottling   Vessels  Normal   Periphery  Normal            IMAGING AND PROCEDURES  Imaging and Procedures for 08/16/21  Intravitreal Injection, Pharmacologic Agent - OS - Left Eye       Time Out 08/16/2021. 10:33 AM. Confirmed correct patient, procedure, site, and patient consented.   Anesthesia Topical anesthesia was used. Anesthetic medications included Lidocaine 4%.   Procedure Preparation included 5% betadine to ocular surface, 10% betadine to eyelids, Tobramycin 0.3%. A 30 gauge needle was used.   Injection: 2.5 mg bevacizumab 2.5 MG/0.1ML   Route: Intravitreal, Site: Left Eye   NDC: [phone], Lot: [id number]   Post-op Post injection exam found visual acuity of at least counting fingers. The patient tolerated the procedure well. There were no complications. The patient received written and verbal post procedure care education. Post injection medications included ocuflox.      OCT, Retina - OU - Both Eyes       Right Eye Quality was good. Scan locations included subfoveal. Central Foveal Thickness: 256. Progression has been stable. Findings include retinal drusen , no SRF.   Left Eye Quality was good. Scan locations included subfoveal. Central Foveal Thickness: 194. Progression has improved. Findings include abnormal foveal contour, retinal drusen , subretinal hyper-reflective material.   Notes Much improved macular anatomy left eye with much less intraretinal fluid, CME and subretinal fluid.  In fact only subretinal hyper reflective material in the foveal location remain, with scarring centrally accounting for acuity with atrophy, at 8-week interval               ASSESSMENT/PLAN:  Exudative age-related macular degeneration of left eye with active choroidal neovascularization  ([place]) OS vastly improved overall since onset of therapy.  Stable acuity.  Atrophy now limits acuity currently at 8-week interval post  Avastin we will repeat injection today and extend interval next attending  Intermediate stage nonexudative age-related macular degeneration of right eye No sign of CNVM OD  Vitreous floaters of right eye Observe OD,     ICD-10-CM   1. Exudative age-related macular degeneration of left eye with active choroidal neovascularization (HCC)  H35.3221 Intravitreal Injection, Pharmacologic Agent - OS - Left Eye    OCT, Retina - OU - Both Eyes    bevacizumab (AVASTIN) SOSY 2.5 mg    2. Intermediate stage nonexudative age-related macular degeneration of right eye  H35.3112     3. Vitreous floaters of right eye  H43.391       1.  OS, vastly improved since onset of therapy August 2022.  Stable acuity yet with central foveal atrophy limiting acuity.  We will repeat injection today in 8 weeks and follow-up next in 10 weeks  2.  Late OU next  3.  Ophthalmic Meds Ordered this visit:  Meds ordered this encounter  Medications   bevacizumab (AVASTIN) SOSY 2.5 mg       Return in about 10 weeks (around 10/25/2021) for DILATE OU, AVASTIN OCT, OS.  There are no Patient Instructions on file for this visit.   Explained the diagnoses, plan, and follow up with the patient and they expressed understanding.  Patient expressed understanding of the importance of proper follow up care.   Clent Demark Karson Reede M.D. Diseases & Surgery of the Retina and Vitreous [facility] 08/16/21     Abbreviations: M myopia (nearsighted); A astigmatism; H hyperopia (farsighted); P presbyopia; Mrx spectacle prescription;  CTL contact lenses; OD right eye; OS left eye; OU both eyes  XT exotropia; ET esotropia; PEK punctate epithelial keratitis; PEE punctate epithelial erosions; DES dry eye syndrome; MGD meibomian gland dysfunction; ATs artificial tears; PFAT's preservative  free artificial tears; [place] nuclear sclerotic cataract; PSC posterior subcapsular cataract; ERM epi-retinal membrane; PVD posterior vitreous detachment; RD retinal detachment; DM diabetes mellitus; DR diabetic retinopathy; NPDR non-proliferative diabetic retinopathy; PDR proliferative diabetic retinopathy; CSME clinically significant macular edema; DME diabetic macular edema; dbh dot blot hemorrhages; CWS cotton wool spot; POAG primary open angle glaucoma; C/D cup-to-disc ratio; HVF humphrey visual field; GVF goldmann visual field; OCT optical coherence tomography; IOP intraocular pressure; BRVO Branch retinal vein occlusion; CRVO central retinal vein occlusion; CRAO central retinal artery occlusion; BRAO branch retinal artery occlusion; RT retinal tear; SB scleral buckle; PPV pars plana vitrectomy; VH Vitreous hemorrhage; PRP panretinal laser photocoagulation; IVK intravitreal kenalog; VMT vitreomacular traction; MH Macular hole;  NVD neovascularization of the disc; NVE neovascularization elsewhere; AREDS age related eye disease study; ARMD age related macular degeneration; POAG primary open angle glaucoma; EBMD epithelial/anterior basement membrane dystrophy; ACIOL anterior chamber intraocular lens; IOL intraocular lens; PCIOL posterior chamber intraocular lens; Phaco/IOL phacoemulsification with intraocular lens placement; [place] photorefractive keratectomy; LASIK laser assisted in situ keratomileusis; HTN hypertension; DM diabetes mellitus; COPD chronic obstructive pulmonary disease

## 2021-08-16 NOTE — Assessment & Plan Note (Signed)
No sign of CNVM OD 

## 2021-08-16 NOTE — Assessment & Plan Note (Signed)
OS vastly improved overall since onset of therapy.  Stable acuity.  Atrophy now limits acuity currently at 8-week interval post Avastin we will repeat injection today and extend interval next attending

## 2021-09-28 DIAGNOSIS — R7303 Prediabetes: Secondary | ICD-10-CM | POA: Diagnosis not present

## 2021-09-28 DIAGNOSIS — E559 Vitamin D deficiency, unspecified: Secondary | ICD-10-CM | POA: Diagnosis not present

## 2021-09-28 DIAGNOSIS — I10 Essential (primary) hypertension: Secondary | ICD-10-CM | POA: Diagnosis not present

## 2021-09-28 DIAGNOSIS — E782 Mixed hyperlipidemia: Secondary | ICD-10-CM | POA: Diagnosis not present

## 2021-09-28 IMAGING — DX DG CHEST 1V PORT
1 series · 1 of 1 positions shown · non-contrast
Comparison: 09/01/2015

CLINICAL DATA: Chest pain for 2 weeks

EXAM:
PORTABLE CHEST 1 VIEW

[chest ap]
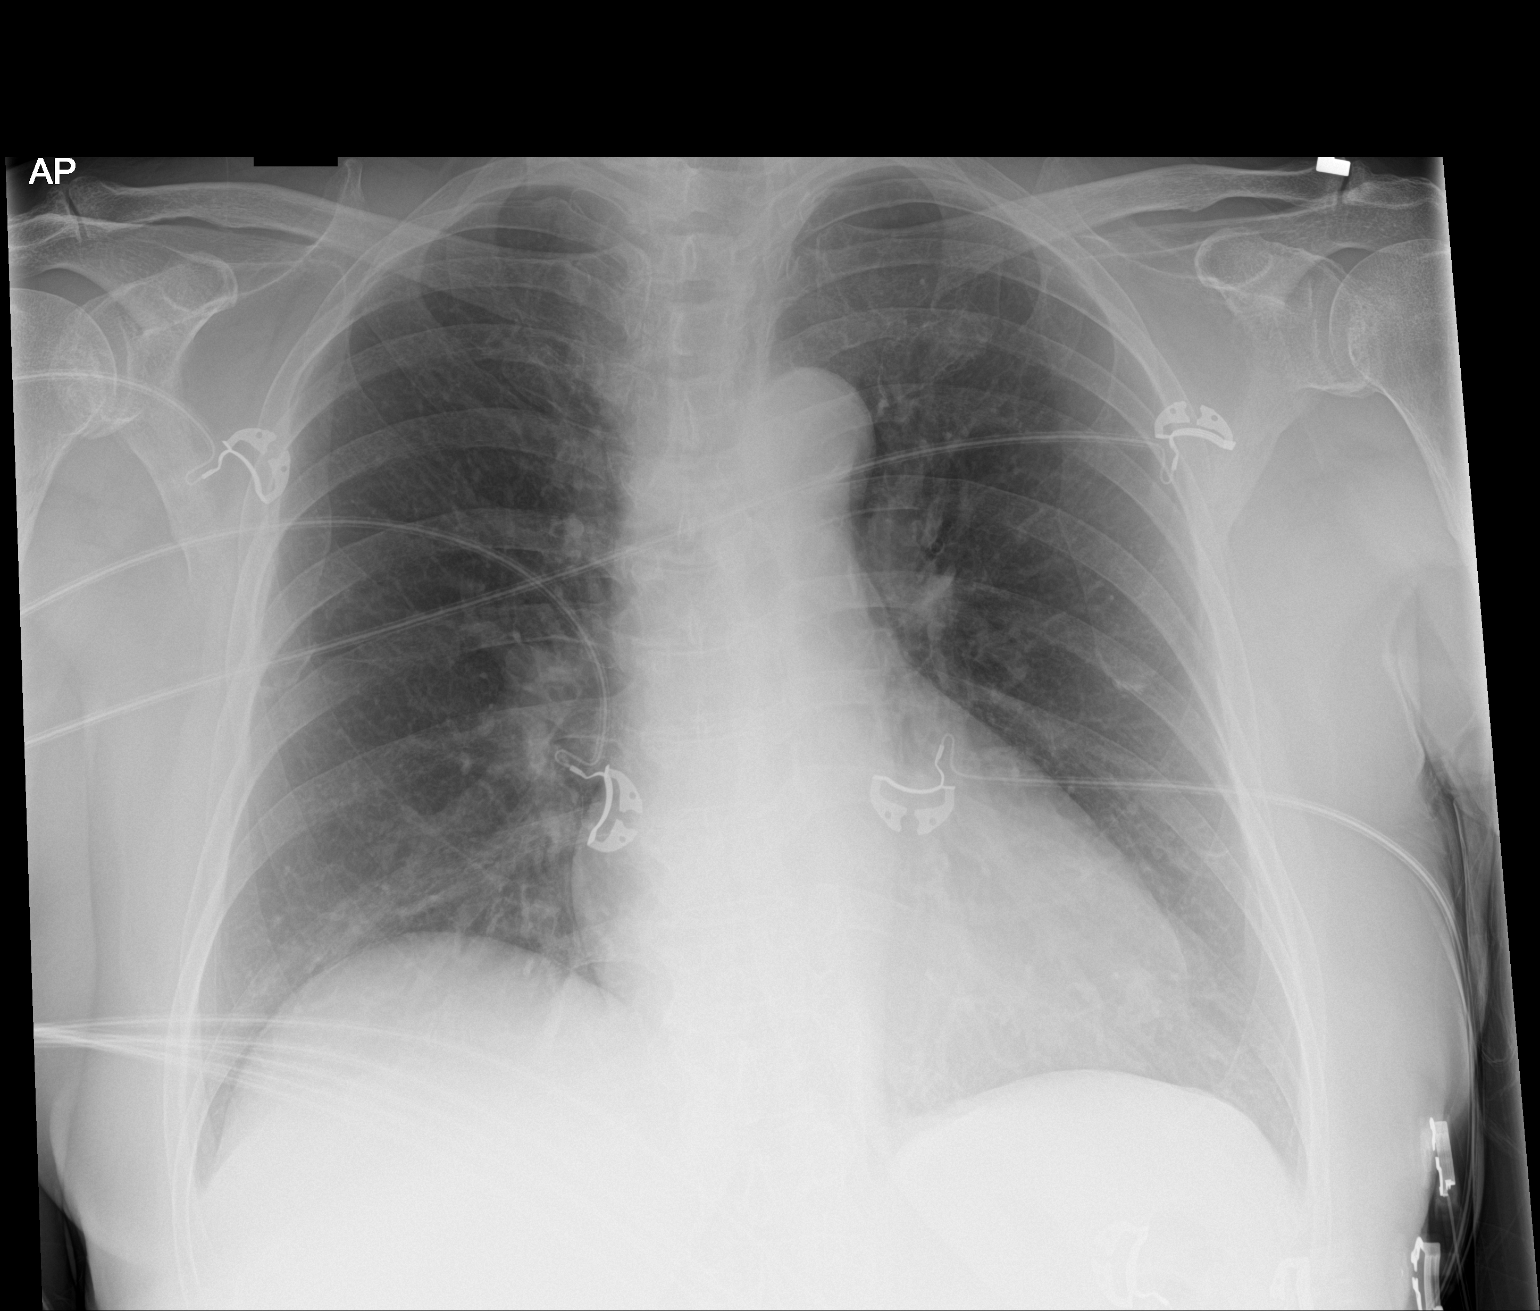

[1 of 1 positions shown; findings below may reference images not displayed]

FINDINGS: The heart size and mediastinal contours are within normal limits.
Atherosclerotic calcification of the aortic knob. No focal airspace
consolidation, pleural effusion, or pneumothorax. The visualized
skeletal structures are unremarkable.
IMPRESSION: No active disease.

## 2021-10-05 ENCOUNTER — Other Ambulatory Visit: Payer: Self-pay | Admitting: Family Medicine

## 2021-10-05 DIAGNOSIS — E559 Vitamin D deficiency, unspecified: Secondary | ICD-10-CM | POA: Diagnosis not present

## 2021-10-05 DIAGNOSIS — E782 Mixed hyperlipidemia: Secondary | ICD-10-CM | POA: Diagnosis not present

## 2021-10-05 DIAGNOSIS — I10 Essential (primary) hypertension: Secondary | ICD-10-CM | POA: Diagnosis not present

## 2021-10-05 DIAGNOSIS — M8588 Other specified disorders of bone density and structure, other site: Secondary | ICD-10-CM | POA: Diagnosis not present

## 2021-10-05 DIAGNOSIS — E1169 Type 2 diabetes mellitus with other specified complication: Secondary | ICD-10-CM | POA: Diagnosis not present

## 2021-10-05 DIAGNOSIS — Z0000 Encounter for general adult medical examination without abnormal findings: Secondary | ICD-10-CM | POA: Diagnosis not present

## 2021-10-25 ENCOUNTER — Ambulatory Visit (INDEPENDENT_AMBULATORY_CARE_PROVIDER_SITE_OTHER): Payer: Medicare HMO | Admitting: Ophthalmology

## 2021-10-25 ENCOUNTER — Encounter (INDEPENDENT_AMBULATORY_CARE_PROVIDER_SITE_OTHER): Payer: Self-pay | Admitting: Ophthalmology

## 2021-10-25 ENCOUNTER — Other Ambulatory Visit: Payer: Self-pay

## 2021-10-25 DIAGNOSIS — H353124 Nonexudative age-related macular degeneration, left eye, advanced atrophic with subfoveal involvement: Secondary | ICD-10-CM

## 2021-10-25 DIAGNOSIS — H353221 Exudative age-related macular degeneration, left eye, with active choroidal neovascularization: Secondary | ICD-10-CM

## 2021-10-25 DIAGNOSIS — H353112 Nonexudative age-related macular degeneration, right eye, intermediate dry stage: Secondary | ICD-10-CM

## 2021-10-25 MED ORDER — BEVACIZUMAB 2.5 MG/0.1ML IZ SOSY
2.5000 mg | PREFILLED_SYRINGE | INTRAVITREAL | Status: AC | PRN
  Administered 2021-10-25: 2.5 mg via INTRAVITREAL

## 2021-10-25 NOTE — Assessment & Plan Note (Signed)
No signs of CNVM OD 

## 2021-10-25 NOTE — Assessment & Plan Note (Addendum)
At 10-week follow-up interval today, resolution of CNVM subfoveal has been maintained successfully.  We will repeat injection Avastin OS today and reevaluate in 3 months ?

## 2021-10-25 NOTE — Assessment & Plan Note (Signed)
atrophy accounts for acuity ?

## 2021-10-25 NOTE — Progress Notes (Signed)
? ? ?10/25/2021 ? ?  ? ?CHIEF COMPLAINT ?Patient presents for  ?Chief Complaint  ?Patient presents with  ? Macular Degeneration  ? ?History of wet AMD subfoveal OS, most recent onset March 2022 ? ? ?HISTORY OF PRESENT ILLNESS: ?Jasmin Young is a 81 y.o. female who presents to the clinic today for:  ? ?HPI   ?10 weeks for dilate, OU AVASTIN OCT, OS. ?Pt states no changes in medical history and vision. ?Pt denies FOL and still sees some floaters. ?Pt states, "something feels wrong in my eye. It feels like an eye booger. It started about a week ago." ? ? ? ?Last edited by Angeline Slim on 10/25/2021  9:40 AM.  ?  ? ? ?Referring physician: ?Daisy Floro, MD ?[address] ?[place],  [place] [address] ? ?HISTORICAL INFORMATION:  ? ?Selected notes from the MEDICAL RECORD NUMBER ?  ?   ? ?CURRENT MEDICATIONS: ?No current outpatient medications on file. (Ophthalmic Drugs)  ? ?No current facility-administered medications for this visit. (Ophthalmic Drugs)  ? ?Current Outpatient Medications (Other)  ?Medication Sig  ? amLODipine (NORVASC) 5 MG tablet Take 5 mg by mouth daily.  ? Ascorbic Acid (VITAMIN C PO) Take 1 tablet by mouth daily.  ? aspirin EC 81 MG tablet Take 81 mg by mouth daily.  ? atorvastatin (LIPITOR) 40 MG tablet   ? b complex vitamins capsule Take 1 capsule by mouth daily.  ? BYSTOLIC 5 MG tablet TAKE 1 TABLET BY MOUTH EVERY DAY  ? calcium carbonate (OS-CAL) 600 MG TABS tablet Take 600 mg by mouth daily.  ? Cholecalciferol (VITAMIN D-3 PO) Take 2,000 Units by mouth daily.  ? hydrochlorothiazide (HYDRODIURIL) 25 MG tablet Take 25 mg by mouth daily.  ? metFORMIN (GLUCOPHAGE-XR) 500 MG 24 hr tablet   ? Multiple Vitamin (MULTIVITAMIN) tablet Take 1 tablet by mouth daily.  ? Multiple Vitamins-Minerals (PRESERVISION AREDS 2 PO) Take 2 capsules by mouth daily.  ? potassium chloride SA (KLOR-CON M20) 20 MEQ tablet Take 1 tablet (20 mEq total) by mouth daily.  ? ?No current facility-administered medications for this visit.  (Other)  ? ? ? ? ?REVIEW OF SYSTEMS: ?ROS   ?Negative for: Constitutional, Gastrointestinal, Neurological, Skin, Genitourinary, Musculoskeletal, HENT, Endocrine, Cardiovascular, Eyes, Respiratory, Psychiatric, Allergic/Imm, Heme/Lymph ?Last edited by Angeline Slim on 10/25/2021  9:39 AM.  ?  ? ? ? ?ALLERGIES ?Allergies  ?Allergen Reactions  ? Ace Inhibitors Cough  ? Penicillins Rash  ? ? ?PAST MEDICAL HISTORY ?Past Medical History:  ?Diagnosis Date  ? Chest pressure   ? Exudative age-related macular degeneration of left eye with active choroidal neovascularization (HCC) 12/19/2019  ? Hyperlipidemia   ? Hypertension   ? Palpitations   ? ?Past Surgical History:  ?Procedure Laterality Date  ? CESAREAN SECTION    ? OVARY SURGERY    ? ? ?FAMILY HISTORY ?Family History  ?Problem Relation Age of Onset  ? Dementia Mother   ? Diabetes Brother   ? ? ?SOCIAL HISTORY ?Social History  ? ?Tobacco Use  ? Smoking status: Never  ? Smokeless tobacco: Never  ?Vaping Use  ? Vaping Use: Never used  ?Substance Use Topics  ? Alcohol use: Never  ? Drug use: Never  ? ?  ? ?  ? ?OPHTHALMIC EXAM: ? ?Base Eye Exam   ? ? Visual Acuity (ETDRS)   ? ?   Right Left  ? Dist cc 20/25 -1 CF at 3'  ? ? Correction: Glasses  ? ?  ?  ? ?  Tonometry (Tonopen, 9:46 AM)   ? ?   Right Left  ? Pressure 13 12  ? ?  ?  ? ? Pupils   ? ?   Pupils APD  ? Right PERRL None  ? Left PERRL None  ? ?  ?  ? ? Visual Fields   ? ?   Left Right  ?  Full Full  ? ?  ?  ? ? Extraocular Movement   ? ?   Right Left  ?  Full Full  ? ?  ?  ? ? Neuro/Psych   ? ? Oriented x3: Yes  ? Mood/Affect: Normal  ? ?  ?  ? ? Dilation   ? ? Left eye: 1.0% Mydriacyl, 2.5% Phenylephrine @ 9:46 AM  ? ?  ?  ? ?  ? ?Slit Lamp and Fundus Exam   ? ? External Exam   ? ?   Right Left  ? External Normal Normal  ? ?  ?  ? ? Slit Lamp Exam   ? ?   Right Left  ? Lids/Lashes Normal Normal  ? Conjunctiva/Sclera White and quiet White and quiet  ? Cornea Clear Clear  ? Anterior Chamber Deep and quiet Deep and quiet  ?  Iris Round and reactive Round and reactive  ? Lens Nuclear sclerosis 2+ Nuclear sclerosis  ? Anterior Vitreous Normal Normal  ? ?  ?  ? ? Fundus Exam   ? ?   Right Left  ? Posterior Vitreous  Posterior vitreous detachment  ? Disc  Normal  ? C/D Ratio  0.4  ? Macula Normal Geographic atrophy, with pigment into the FAZ, no fluid, no thickening, no hemorrhage, clinically detectable, Retinal pigment epithelial mottling  ? Vessels  Normal  ? Periphery  Normal  ? ?  ?  ? ?  ? ? ?IMAGING AND PROCEDURES  ?Imaging and Procedures for 10/25/21 ? ?OCT, Retina - OU - Both Eyes   ? ?   ?Right Eye ?Quality was good. Scan locations included subfoveal. Central Foveal Thickness: 253. Progression has been stable. Findings include retinal drusen , no SRF.  ? ?Left Eye ?Quality was good. Scan locations included subfoveal. Central Foveal Thickness: 186. Progression has improved. Findings include abnormal foveal contour, retinal drusen , subretinal hyper-reflective material.  ? ?Notes ?Much improved macular anatomy left eye with much less intraretinal fluid, CME and subretinal fluid.  In fact only subretinal hyper reflective material in the foveal location remain, with scarring centrally accounting for acuity with atrophy, at 10-week interval ? ? ? ?  ? ?Intravitreal Injection, Pharmacologic Agent - OS - Left Eye   ? ?   ?Time Out ?10/25/2021. 10:14 AM. Confirmed correct patient, procedure, site, and patient consented.  ? ?Anesthesia ?Topical anesthesia was used. Anesthetic medications included Lidocaine 4%.  ? ?Procedure ?Preparation included 5% betadine to ocular surface, 10% betadine to eyelids, Tobramycin 0.3%. A 30 gauge needle was used.  ? ?Injection: ?2.5 mg bevacizumab 2.5 MG/0.1ML ?  Route: Intravitreal, Site: Left Eye ?  NDC: 16109-604-54, Lot: [id number]  ? ?Post-op ?Post injection exam found visual acuity of at least counting fingers. The patient tolerated the procedure well. There were no complications. The patient received  written and verbal post procedure care education. Post injection medications included ocuflox.  ? ?  ? ? ?  ?  ? ?  ?ASSESSMENT/PLAN: ? ?Intermediate stage nonexudative age-related macular degeneration of right eye ?No signs of CNVM OD ? ?Exudative age-related macular degeneration  of left eye with active choroidal neovascularization (HCC) ?At 10-week follow-up interval today, resolution of CNVM subfoveal has been maintained successfully.  We will repeat injection Avastin OS today and reevaluate in 3 months ? ?Advanced nonexudative age-related macular degeneration of left eye with subfoveal involvement ? atrophy accounts for acuity  ? ?  ICD-10-CM   ?1. Exudative age-related macular degeneration of left eye with active choroidal neovascularization (HCC)  H35.3221 OCT, Retina - OU - Both Eyes  ?  Intravitreal Injection, Pharmacologic Agent - OS - Left Eye  ?  bevacizumab (AVASTIN) SOSY 2.5 mg  ?  ?2. Intermediate stage nonexudative age-related macular degeneration of right eye  H35.3112   ?  ?3. Advanced nonexudative age-related macular degeneration of left eye with subfoveal involvement  H35.3124   ?  ? ? ?1.  OD, no sign of CNVM ? ?2.  OS, subfoveal CNVM well-controlled with residual scarring accounting for acuity.  Today at 10-week follow-up interval post Avastin.  Onset nearly 1 year previous ? ?Repeat injection today ? ?3.  Dilate OU in 3 months likely maintenance injection OS next ? ?Ophthalmic Meds Ordered this visit:  ?Meds ordered this encounter  ?Medications  ? bevacizumab (AVASTIN) SOSY 2.5 mg  ? ? ?  ? ?Return in about 3 months (around 01/25/2022) for DILATE OU, AVASTIN OCT, OS. ? ?There are no Patient Instructions on file for this visit. ? ? ?Explained the diagnoses, plan, and follow up with the patient and they expressed understanding.  Patient expressed understanding of the importance of proper follow up care.  ? ?Alford Highland. Huntley Knoop M.D. ?Diseases & Surgery of the Retina and Vitreous ?[facility] ?10/25/21 ? ? ? ? ?Abbreviations: ?M myopia (nearsighted); A astigmatism; H hyperopia (farsighted); P presbyopia; Mrx spectacle prescription;  CTL contact lenses; OD right eye; OS left eye; OU both e

## 2021-11-11 DIAGNOSIS — I10 Essential (primary) hypertension: Secondary | ICD-10-CM | POA: Diagnosis not present

## 2021-11-11 DIAGNOSIS — M1990 Unspecified osteoarthritis, unspecified site: Secondary | ICD-10-CM | POA: Diagnosis not present

## 2021-11-11 DIAGNOSIS — E1169 Type 2 diabetes mellitus with other specified complication: Secondary | ICD-10-CM | POA: Diagnosis not present

## 2022-01-25 ENCOUNTER — Encounter (INDEPENDENT_AMBULATORY_CARE_PROVIDER_SITE_OTHER): Payer: Medicare HMO | Admitting: Ophthalmology

## 2022-02-01 ENCOUNTER — Ambulatory Visit (INDEPENDENT_AMBULATORY_CARE_PROVIDER_SITE_OTHER): Payer: Medicare HMO | Admitting: Ophthalmology

## 2022-02-01 ENCOUNTER — Encounter (INDEPENDENT_AMBULATORY_CARE_PROVIDER_SITE_OTHER): Payer: Self-pay | Admitting: Ophthalmology

## 2022-02-01 DIAGNOSIS — H2513 Age-related nuclear cataract, bilateral: Secondary | ICD-10-CM

## 2022-02-01 DIAGNOSIS — H353112 Nonexudative age-related macular degeneration, right eye, intermediate dry stage: Secondary | ICD-10-CM | POA: Diagnosis not present

## 2022-02-01 DIAGNOSIS — H353124 Nonexudative age-related macular degeneration, left eye, advanced atrophic with subfoveal involvement: Secondary | ICD-10-CM | POA: Diagnosis not present

## 2022-02-01 DIAGNOSIS — H353221 Exudative age-related macular degeneration, left eye, with active choroidal neovascularization: Secondary | ICD-10-CM | POA: Diagnosis not present

## 2022-02-01 MED ORDER — BEVACIZUMAB 2.5 MG/0.1ML IZ SOSY
2.5000 mg | PREFILLED_SYRINGE | INTRAVITREAL | Status: AC | PRN
  Administered 2022-02-01: 2.5 mg via INTRAVITREAL

## 2022-02-01 NOTE — Progress Notes (Signed)
02/01/2022     CHIEF COMPLAINT Patient presents for  Chief Complaint  Patient presents with   Macular Degeneration      HISTORY OF PRESENT ILLNESS: Jasmin Young is a 81 y.o. female who presents to the clinic today for:   HPI   3 mos dilate OU Avastin OS OCT. Patient states vision is stable and unchanged since last visit. Denies any new floaters or FOL.  Last edited by Nelva Nay on 02/01/2022  9:36 AM.      Referring physician: Daisy Floro, MD [address] [place],  [place] [address]  HISTORICAL INFORMATION:   Selected notes from the MEDICAL RECORD NUMBER       CURRENT MEDICATIONS: No current outpatient medications on file. (Ophthalmic Drugs)   No current facility-administered medications for this visit. (Ophthalmic Drugs)   Current Outpatient Medications (Other)  Medication Sig   amLODipine (NORVASC) 5 MG tablet Take 5 mg by mouth daily.   Ascorbic Acid (VITAMIN C PO) Take 1 tablet by mouth daily.   aspirin EC 81 MG tablet Take 81 mg by mouth daily.   atorvastatin (LIPITOR) 40 MG tablet    b complex vitamins capsule Take 1 capsule by mouth daily.   BYSTOLIC 5 MG tablet TAKE 1 TABLET BY MOUTH EVERY DAY   calcium carbonate (OS-CAL) 600 MG TABS tablet Take 600 mg by mouth daily.   Cholecalciferol (VITAMIN D-3 PO) Take 2,000 Units by mouth daily.   hydrochlorothiazide (HYDRODIURIL) 25 MG tablet Take 25 mg by mouth daily.   metFORMIN (GLUCOPHAGE-XR) 500 MG 24 hr tablet    Multiple Vitamin (MULTIVITAMIN) tablet Take 1 tablet by mouth daily.   Multiple Vitamins-Minerals (PRESERVISION AREDS 2 PO) Take 2 capsules by mouth daily.   potassium chloride SA (KLOR-CON M20) 20 MEQ tablet Take 1 tablet (20 mEq total) by mouth daily.   No current facility-administered medications for this visit. (Other)      REVIEW OF SYSTEMS: ROS   Negative for: Constitutional, Gastrointestinal, Neurological, Skin, Genitourinary, Musculoskeletal, HENT, Endocrine,  Cardiovascular, Eyes, Respiratory, Psychiatric, Allergic/Imm, Heme/Lymph Last edited by Edmon Crape, MD on 02/01/2022 10:34 AM.       ALLERGIES Allergies  Allergen Reactions   Ace Inhibitors Cough   Penicillins Rash    PAST MEDICAL HISTORY Past Medical History:  Diagnosis Date   Chest pressure    Exudative age-related macular degeneration of left eye with active choroidal neovascularization (HCC) 12/19/2019   Hyperlipidemia    Hypertension    Palpitations    Past Surgical History:  Procedure Laterality Date   CESAREAN SECTION     OVARY SURGERY      FAMILY HISTORY Family History  Problem Relation Age of Onset   Dementia Mother    Diabetes Brother     SOCIAL HISTORY Social History   Tobacco Use   Smoking status: Never   Smokeless tobacco: Never  Vaping Use   Vaping Use: Never used  Substance Use Topics   Alcohol use: Never   Drug use: Never         OPHTHALMIC EXAM:  Base Eye Exam     Visual Acuity (ETDRS)       Right Left   Dist cc 20/25 CF at 3'   Dist ph cc  NI    Correction: Glasses         Tonometry (Tonopen, 9:40 AM)       Right Left   Pressure 13 14  Pupils       Pupils Dark Light APD   Right PERRL 5 4 None   Left PERRL 5 4 None         Extraocular Movement       Right Left    Full Full         Neuro/Psych     Oriented x3: Yes   Mood/Affect: Normal         Dilation     Both eyes: 1.0% Mydriacyl, 2.5% Phenylephrine @ 9:40 AM           Slit Lamp and Fundus Exam     External Exam       Right Left   External Normal Normal         Slit Lamp Exam       Right Left   Lids/Lashes Normal Normal   Conjunctiva/Sclera White and quiet White and quiet   Cornea Clear Clear   Anterior Chamber Deep and quiet Deep and quiet   Iris Round and reactive Round and reactive   Lens 2+ Nuclear sclerosis 2+ Nuclear sclerosis   Anterior Vitreous Normal Normal         Fundus Exam       Right Left    Posterior Vitreous Posterior vitreous detachment Posterior vitreous detachment   Disc Normal Normal   C/D Ratio 0.4 0.4   Macula Hard drusen, Early age related macular degeneration Geographic atrophy, with pigment into the FAZ, no fluid, no thickening, no hemorrhage, clinically detectable, Retinal pigment epithelial mottling   Vessels Normal Normal   Periphery Normal Normal            IMAGING AND PROCEDURES  Imaging and Procedures for 02/01/22  Intravitreal Injection, Pharmacologic Agent - OS - Left Eye       Time Out 02/01/2022. 10:38 AM. Confirmed correct patient, procedure, site, and patient consented.   Anesthesia Topical anesthesia was used. Anesthetic medications included Lidocaine 4%.   Procedure Preparation included 5% betadine to ocular surface, 10% betadine to eyelids, Tobramycin 0.3%. A 30 gauge needle was used.   Injection: 2.5 mg bevacizumab 2.5 MG/0.1ML   Route: Intravitreal, Site: Left Eye   NDC: [phone], Lot: [id number], Expiration date: 03/10/2022   Post-op Post injection exam found visual acuity of at least counting fingers. The patient tolerated the procedure well. There were no complications. The patient received written and verbal post procedure care education. Post injection medications included ocuflox.      OCT, Retina - OU - Both Eyes       Right Eye Quality was good. Scan locations included subfoveal. Central Foveal Thickness: 248. Progression has been stable. Findings include no SRF, retinal drusen .   Left Eye Quality was good. Scan locations included subfoveal. Central Foveal Thickness: 178. Progression has improved. Findings include abnormal foveal contour, retinal drusen , subretinal hyper-reflective material.   Notes Much improved macular anatomy left eye with much less intraretinal fluid, CME and subretinal fluid.  In fact only subretinal hyper reflective material in the foveal location remain, with scarring centrally accounting for  acuity with atrophy, at 3 months interval                ASSESSMENT/PLAN:  Advanced nonexudative age-related macular degeneration of left eye with subfoveal involvement The nature of wet macular degeneration was discussed with the patient.  Forms of therapy reviewed include the use of Anti-VEGF medications injected painlessly into the eye, as well as other possible treatment modalities, including thermal  laser therapy. Fellow eye involvement and risks were discussed with the patient. Upon the finding of wet age related macular degeneration, treatment will be offered. The treatment regimen is on a treat as needed basis with the intent to treat if necessary and extend interval of exams when possible. On average 1 out of 6 patients do not need lifetime therapy. However, the risk of recurrent disease is high for a lifetime.  Initially monthly, then periodic, examinations and evaluations will determine whether the next treatment is required on the day of the examination.  Onset March 2022 subfoveal, large lesion now undergoing atrophic changes and controlled at 77-month interval today post Avastin.  We will repeat today for maintenance  Intermediate stage nonexudative age-related macular degeneration of right eye No sign of CNVM OD by exam  Nuclear sclerotic cataract of both eyes Moderate OU not impactful visually OS     ICD-10-CM   1. Exudative age-related macular degeneration of left eye with active choroidal neovascularization (HCC)  H35.3221 Intravitreal Injection, Pharmacologic Agent - OS - Left Eye    OCT, Retina - OU - Both Eyes    bevacizumab (AVASTIN) SOSY 2.5 mg    2. Advanced nonexudative age-related macular degeneration of left eye with subfoveal involvement  H35.3124     3. Intermediate stage nonexudative age-related macular degeneration of right eye  H35.3112     4. Nuclear sclerotic cataract of both eyes  H25.13       OS, much less disease activity subfoveal CNVM as  compared to March 2022.  Stable at 4-month interval today.  Repeat injection today  2.  OS with subfoveal geographic atrophy  3.  OD no sign of CNVM  4.  Early visually significant cataract OU  Ophthalmic Meds Ordered this visit:  Meds ordered this encounter  Medications   bevacizumab (AVASTIN) SOSY 2.5 mg       Return in about 11 weeks (around 04/19/2022) for DILATE OU, AVASTIN OCT, OS.  There are no Patient Instructions on file for this visit.   Explained the diagnoses, plan, and follow up with the patient and they expressed understanding.  Patient expressed understanding of the importance of proper follow up care.   Alford Highland Cayleen Benjamin M.D. Diseases & Surgery of the Retina and Vitreous [facility] 02/01/22     Abbreviations: M myopia (nearsighted); A astigmatism; H hyperopia (farsighted); P presbyopia; Mrx spectacle prescription;  CTL contact lenses; OD right eye; OS left eye; OU both eyes  XT exotropia; ET esotropia; PEK punctate epithelial keratitis; PEE punctate epithelial erosions; DES dry eye syndrome; MGD meibomian gland dysfunction; ATs artificial tears; PFAT's preservative free artificial tears; NSC nuclear sclerotic cataract; PSC posterior subcapsular cataract; ERM epi-retinal membrane; PVD posterior vitreous detachment; RD retinal detachment; DM diabetes mellitus; DR diabetic retinopathy; NPDR non-proliferative diabetic retinopathy; PDR proliferative diabetic retinopathy; CSME clinically significant macular edema; DME diabetic macular edema; dbh dot blot hemorrhages; CWS cotton wool spot; POAG primary open angle glaucoma; C/D cup-to-disc ratio; HVF humphrey visual field; GVF goldmann visual field; OCT optical coherence tomography; IOP intraocular pressure; BRVO Branch retinal vein occlusion; CRVO central retinal vein occlusion; CRAO central retinal artery occlusion; BRAO branch retinal artery occlusion; RT retinal tear; SB scleral buckle; PPV pars plana  vitrectomy; VH Vitreous hemorrhage; PRP panretinal laser photocoagulation; IVK intravitreal kenalog; VMT vitreomacular traction; MH Macular hole;  NVD neovascularization of the disc; NVE neovascularization elsewhere; AREDS age related eye disease study; ARMD age related macular degeneration; POAG primary open angle glaucoma;  EBMD epithelial/anterior basement membrane dystrophy; ACIOL anterior chamber intraocular lens; IOL intraocular lens; PCIOL posterior chamber intraocular lens; Phaco/IOL phacoemulsification with intraocular lens placement; PRK photorefractive keratectomy; LASIK laser assisted in situ keratomileusis; HTN hypertension; DM diabetes mellitus; COPD chronic obstructive pulmonary disease

## 2022-02-01 NOTE — Assessment & Plan Note (Signed)
Moderate OU not impactful visually OS

## 2022-02-01 NOTE — Assessment & Plan Note (Signed)
The nature of wet macular degeneration was discussed with the patient.  Forms of therapy reviewed include the use of Anti-VEGF medications injected painlessly into the eye, as well as other possible treatment modalities, including thermal laser therapy. Fellow eye involvement and risks were discussed with the patient. Upon the finding of wet age related macular degeneration, treatment will be offered. The treatment regimen is on a treat as needed basis with the intent to treat if necessary and extend interval of exams when possible. On average 1 out of 6 patients do not need lifetime therapy. However, the risk of recurrent disease is high for a lifetime.  Initially monthly, then periodic, examinations and evaluations will determine whether the next treatment is required on the day of the examination.  Onset March 2022 subfoveal, large lesion now undergoing atrophic changes and controlled at 19-month interval today post Avastin.  We will repeat today for maintenance

## 2022-02-17 DIAGNOSIS — H5213 Myopia, bilateral: Secondary | ICD-10-CM | POA: Diagnosis not present

## 2022-02-17 DIAGNOSIS — H353111 Nonexudative age-related macular degeneration, right eye, early dry stage: Secondary | ICD-10-CM | POA: Diagnosis not present

## 2022-02-17 DIAGNOSIS — H353222 Exudative age-related macular degeneration, left eye, with inactive choroidal neovascularization: Secondary | ICD-10-CM | POA: Diagnosis not present

## 2022-02-17 DIAGNOSIS — H2513 Age-related nuclear cataract, bilateral: Secondary | ICD-10-CM | POA: Diagnosis not present

## 2022-04-05 DIAGNOSIS — E1169 Type 2 diabetes mellitus with other specified complication: Secondary | ICD-10-CM | POA: Diagnosis not present

## 2022-04-05 DIAGNOSIS — L821 Other seborrheic keratosis: Secondary | ICD-10-CM | POA: Diagnosis not present

## 2022-04-05 DIAGNOSIS — I10 Essential (primary) hypertension: Secondary | ICD-10-CM | POA: Diagnosis not present

## 2022-04-19 ENCOUNTER — Ambulatory Visit (INDEPENDENT_AMBULATORY_CARE_PROVIDER_SITE_OTHER): Payer: Medicare HMO | Admitting: Ophthalmology

## 2022-04-19 ENCOUNTER — Encounter (INDEPENDENT_AMBULATORY_CARE_PROVIDER_SITE_OTHER): Payer: Self-pay | Admitting: Ophthalmology

## 2022-04-19 DIAGNOSIS — H353221 Exudative age-related macular degeneration, left eye, with active choroidal neovascularization: Secondary | ICD-10-CM | POA: Diagnosis not present

## 2022-04-19 DIAGNOSIS — H2513 Age-related nuclear cataract, bilateral: Secondary | ICD-10-CM | POA: Diagnosis not present

## 2022-04-19 DIAGNOSIS — H43391 Other vitreous opacities, right eye: Secondary | ICD-10-CM | POA: Diagnosis not present

## 2022-04-19 DIAGNOSIS — H353112 Nonexudative age-related macular degeneration, right eye, intermediate dry stage: Secondary | ICD-10-CM | POA: Diagnosis not present

## 2022-04-19 MED ORDER — BEVACIZUMAB CHEMO INJECTION 1.25MG/0.05ML SYRINGE FOR KALEIDOSCOPE
1.2500 mg | INTRAVITREAL | Status: AC | PRN
  Administered 2022-04-19: 1.25 mg via INTRAVITREAL

## 2022-04-19 NOTE — Assessment & Plan Note (Signed)
Continue on follow-up Dr. Manning Charity as scheduled

## 2022-04-19 NOTE — Assessment & Plan Note (Signed)

## 2022-04-19 NOTE — Progress Notes (Signed)
04/19/2022     CHIEF COMPLAINT Patient presents for  Chief Complaint  Patient presents with   Macular Degeneration   No interval change in acuity OU   HISTORY OF PRESENT ILLNESS: Jasmin Young is a 81 y.o. female who presents to the clinic today for:   HPI   Macular degeneration of left eye 11 weeks dilate ou avastin oct os Pt states her vision has been stable Pt denies any new floaters or FOL Last edited by Aleene Davidson, CMA on 04/19/2022  9:35 AM.      Referring physician: Manning Charity, OD [address] [place],  [place] [address]  HISTORICAL INFORMATION:   Selected notes from the MEDICAL RECORD NUMBER       CURRENT MEDICATIONS: No current outpatient medications on file. (Ophthalmic Drugs)   No current facility-administered medications for this visit. (Ophthalmic Drugs)   Current Outpatient Medications (Other)  Medication Sig   amLODipine (NORVASC) 5 MG tablet Take 5 mg by mouth daily.   Ascorbic Acid (VITAMIN C PO) Take 1 tablet by mouth daily.   aspirin EC 81 MG tablet Take 81 mg by mouth daily.   atorvastatin (LIPITOR) 40 MG tablet    b complex vitamins capsule Take 1 capsule by mouth daily.   BYSTOLIC 5 MG tablet TAKE 1 TABLET BY MOUTH EVERY DAY   calcium carbonate (OS-CAL) 600 MG TABS tablet Take 600 mg by mouth daily.   Cholecalciferol (VITAMIN D-3 PO) Take 2,000 Units by mouth daily.   hydrochlorothiazide (HYDRODIURIL) 25 MG tablet Take 25 mg by mouth daily.   metFORMIN (GLUCOPHAGE-XR) 500 MG 24 hr tablet    Multiple Vitamin (MULTIVITAMIN) tablet Take 1 tablet by mouth daily.   Multiple Vitamins-Minerals (PRESERVISION AREDS 2 PO) Take 2 capsules by mouth daily.   potassium chloride SA (KLOR-CON M20) 20 MEQ tablet Take 1 tablet (20 mEq total) by mouth daily.   No current facility-administered medications for this visit. (Other)      REVIEW OF SYSTEMS: ROS   Negative for: Constitutional, Gastrointestinal, Neurological, Skin, Genitourinary,  Musculoskeletal, HENT, Endocrine, Cardiovascular, Eyes, Respiratory, Psychiatric, Allergic/Imm, Heme/Lymph Last edited by Erling Cruz D, CMA on 04/19/2022  9:35 AM.       ALLERGIES Allergies  Allergen Reactions   Ace Inhibitors Cough   Penicillins Rash    PAST MEDICAL HISTORY Past Medical History:  Diagnosis Date   Chest pressure    Exudative age-related macular degeneration of left eye with active choroidal neovascularization (HCC) 12/19/2019   Hyperlipidemia    Hypertension    Palpitations    Past Surgical History:  Procedure Laterality Date   CESAREAN SECTION     OVARY SURGERY      FAMILY HISTORY Family History  Problem Relation Age of Onset   Dementia Mother    Diabetes Brother     SOCIAL HISTORY Social History   Tobacco Use   Smoking status: Never   Smokeless tobacco: Never  Vaping Use   Vaping Use: Never used  Substance Use Topics   Alcohol use: Never   Drug use: Never         OPHTHALMIC EXAM:  Base Eye Exam     Visual Acuity (Snellen - Linear)       Right Left   Dist cc 20/25 -1 CF at 3'         Tonometry (Tonopen, 9:39 AM)       Right Left   Pressure 6 9  Pupils       Pupils   Right PERRL   Left PERRL         Visual Fields       Left Right    Full Full         Extraocular Movement       Right Left    Ortho Ortho    -- -- --  --  --  -- -- --   -- -- --  --  --  -- -- --           Neuro/Psych     Oriented x3: Yes   Mood/Affect: Normal         Dilation     Both eyes: 1.0% Mydriacyl, 2.5% Phenylephrine @ 9:41 AM           Slit Lamp and Fundus Exam     External Exam       Right Left   External Normal Normal         Slit Lamp Exam       Right Left   Lids/Lashes Normal Normal   Conjunctiva/Sclera White and quiet White and quiet   Cornea Clear Clear   Anterior Chamber Deep and quiet Deep and quiet   Iris Round and reactive Round and reactive   Lens 2+ Nuclear sclerosis  2+ Nuclear sclerosis   Anterior Vitreous Normal Normal         Fundus Exam       Right Left   Posterior Vitreous Posterior vitreous detachment Posterior vitreous detachment   Disc Normal Normal   C/D Ratio 0.4 0.4   Macula Hard drusen, Advanced age related macular degeneration, Geographic atrophy Geographic atrophy, with pigment into the FAZ, no fluid, no thickening, no hemorrhage, clinically detectable, Retinal pigment epithelial mottling   Vessels Normal Normal   Periphery Normal Normal            IMAGING AND PROCEDURES  Imaging and Procedures for 04/19/22  OCT, Retina - OU - Both Eyes       Right Eye Quality was good. Scan locations included subfoveal. Central Foveal Thickness: 252. Progression has been stable. Findings include no SRF, retinal drusen .   Left Eye Quality was good. Scan locations included subfoveal. Central Foveal Thickness: 178. Progression has improved. Findings include abnormal foveal contour, retinal drusen , subretinal hyper-reflective material.   Notes Much improved macular anatomy left eye with much less intraretinal fluid, CME and subretinal fluid.  In fact only subretinal hyper reflective material in the foveal location remain, with scarring centrally accounting for acuity with atrophy, at 3 months interval       Intravitreal Injection, Pharmacologic Agent - OS - Left Eye       Time Out 04/19/2022. 10:27 AM. Confirmed correct patient, procedure, site, and patient consented.   Anesthesia Topical anesthesia was used. Anesthetic medications included Lidocaine 4%.   Procedure Preparation included 5% betadine to ocular surface, 10% betadine to eyelids, Tobramycin 0.3%. A 30 gauge needle was used.   Injection: 1.25 mg Bevacizumab 1.25mg /0.23ml   Route: Intravitreal, Site: Left Eye   NDC: [id number]   Post-op Post injection exam found visual acuity of at least counting fingers. The patient tolerated the procedure well. There were no  complications. The patient received written and verbal post procedure care education. Post injection medications included ocuflox.              ASSESSMENT/PLAN:  Exudative age-related macular degeneration of left eye with active  choroidal neovascularization (HCC) The nature of wet macular degeneration was discussed with the patient.  Forms of therapy reviewed include the use of Anti-VEGF medications injected painlessly into the eye, as well as other possible treatment modalities, including thermal laser therapy. Fellow eye involvement and risks were discussed with the patient. Upon the finding of wet age related macular degeneration, treatment will be offered. The treatment regimen is on a treat as needed basis with the intent to treat if necessary and extend interval of exams when possible. On average 1 out of 6 patients do not need lifetime therapy. However, the risk of recurrent disease is high for a lifetime.  Initially monthly, then periodic, examinations and evaluations will determine whether the next treatment is required on the day of the examination.The nature of wet macular degeneration was discussed with the patient.  Forms of therapy reviewed include the use of Anti-VEGF medications injected painlessly into the eye, as well as other possible treatment modalities, including thermal laser therapy. Fellow eye involvement and risks were discussed with the patient. Upon the finding of wet age related macular degeneration, treatment will be offered. The treatment regimen is on a treat as needed basis with the intent to treat if necessary and extend interval of exams when possible. On average 1 out of 6 patients do not need lifetime therapy. However, the risk of recurrent disease is high for a lifetime.  Initially monthly, then periodic, examinations and evaluations will determine whether the next treatment is required on the day of the examination.The nature of wet macular degeneration was  discussed with the patient.  Forms of therapy reviewed include the use of Anti-VEGF medications injected painlessly into the eye, as well as other possible treatment modalities, including thermal laser therapy. Fellow eye involvement and risks were discussed with the patient. Upon the finding of wet age related macular degeneration, treatment will be offered. The treatment regimen is on a treat as needed basis with the intent to treat if necessary and extend interval of exams when possible. On average 1 out of 6 patients do not need lifetime therapy. However, the risk of recurrent disease is high for a lifetime.  Initially monthly, then periodic, examinations and evaluations will determine whether the next treatment is required on the day of the examination.  Nuclear sclerotic cataract of both eyes Continue on follow-up Dr. Manning Charity as scheduled  Vitreous floaters of right eye Minor no impact on acuity  Intermediate stage nonexudative age-related macular degeneration of right eye The nature of age--related macular degeneration was discussed with the patient as well as the distinction between dry and wet types. Checking an Amsler Grid daily with advice to return immediately should a distortion develop, was given to the patient. The patient 's smoking status now and in the past was determined and advice based on the AREDS study was provided regarding the consumption of antioxidant supplements. AREDS 2 vitamin formulation was recommended. Consumption of dark leafy vegetables and fresh fruits of various colors was recommended. Treatment modalities for wet macular degeneration particularly the use of intravitreal injections of anti-blood vessel growth factors was discussed with the patient. Avastin, Lucentis, and Eylea are the available options. On occasion, therapy includes the use of photodynamic therapy and thermal laser. Stressed to the patient do not rub eyes.  Patient was advised to check Amsler Grid  daily and return immediately if changes are noted. Instructions on using the grid were given to the patient. All patient questions were answered.  ICD-10-CM   1. Exudative age-related macular degeneration of left eye with active choroidal neovascularization (HCC)  H35.3221 OCT, Retina - OU - Both Eyes    Intravitreal Injection, Pharmacologic Agent - OS - Left Eye    Bevacizumab (AVASTIN) SOLN 1.25 mg    2. Nuclear sclerotic cataract of both eyes  H25.13     3. Vitreous floaters of right eye  H43.391     4. Intermediate stage nonexudative age-related macular degeneration of right eye  H35.3112       1.  OS repeat injection intravitreal Avastin to control condition to prevent recurrence currently at 11 to 12 weeks follow-up.  2.  Bilateral cataracts follow-up Dr. Melissa Noon as scheduled.  Monitor  3.  Ophthalmic Meds Ordered this visit:  Meds ordered this encounter  Medications   Bevacizumab (AVASTIN) SOLN 1.25 mg       Return in about 4 months (around 08/19/2022) for DILATE OU, COLOR FP, OCT.  There are no Patient Instructions on file for this visit.   Explained the diagnoses, plan, and follow up with the patient and they expressed understanding.  Patient expressed understanding of the importance of proper follow up care.   Clent Demark Maddux First M.D. Diseases & Surgery of the Retina and Vitreous [facility] [place] 04/19/22     Abbreviations: M myopia (nearsighted); A astigmatism; H hyperopia (farsighted); P presbyopia; Mrx spectacle prescription;  CTL contact lenses; OD right eye; OS left eye; OU both eyes  XT exotropia; ET esotropia; PEK punctate epithelial keratitis; PEE punctate epithelial erosions; DES dry eye syndrome; MGD meibomian gland dysfunction; ATs artificial tears; PFAT's preservative free artificial tears; [place] nuclear sclerotic cataract; PSC posterior subcapsular cataract; ERM epi-retinal membrane; PVD posterior vitreous detachment; RD retinal  detachment; DM diabetes mellitus; DR diabetic retinopathy; NPDR non-proliferative diabetic retinopathy; PDR proliferative diabetic retinopathy; CSME clinically significant macular edema; DME diabetic macular edema; dbh dot blot hemorrhages; CWS cotton wool spot; POAG primary open angle glaucoma; C/D cup-to-disc ratio; HVF humphrey visual field; GVF goldmann visual field; OCT optical coherence tomography; IOP intraocular pressure; BRVO Branch retinal vein occlusion; CRVO central retinal vein occlusion; CRAO central retinal artery occlusion; BRAO branch retinal artery occlusion; RT retinal tear; SB scleral buckle; PPV pars plana vitrectomy; VH Vitreous hemorrhage; PRP panretinal laser photocoagulation; IVK intravitreal kenalog; VMT vitreomacular traction; MH Macular hole;  NVD neovascularization of the disc; NVE neovascularization elsewhere; AREDS age related eye disease study; ARMD age related macular degeneration; POAG primary open angle glaucoma; EBMD epithelial/anterior basement membrane dystrophy; ACIOL anterior chamber intraocular lens; IOL intraocular lens; PCIOL posterior chamber intraocular lens; Phaco/IOL phacoemulsification with intraocular lens placement; [place] photorefractive keratectomy; LASIK laser assisted in situ keratomileusis; HTN hypertension; DM diabetes mellitus; COPD chronic obstructive pulmonary disease

## 2022-04-19 NOTE — Assessment & Plan Note (Signed)
The nature of wet macular degeneration was discussed with the patient.  Forms of therapy reviewed include the use of Anti-VEGF medications injected painlessly into the eye, as well as other possible treatment modalities, including thermal laser therapy. Fellow eye involvement and risks were discussed with the patient. Upon the finding of wet age related macular degeneration, treatment will be offered. The treatment regimen is on a treat as needed basis with the intent to treat if necessary and extend interval of exams when possible. On average 1 out of 6 patients do not need lifetime therapy. However, the risk of recurrent disease is high for a lifetime.  Initially monthly, then periodic, examinations and evaluations will determine whether the next treatment is required on the day of the examination.The nature of wet macular degeneration was discussed with the patient.  Forms of therapy reviewed include the use of Anti-VEGF medications injected painlessly into the eye, as well as other possible treatment modalities, including thermal laser therapy. Fellow eye involvement and risks were discussed with the patient. Upon the finding of wet age related macular degeneration, treatment will be offered. The treatment regimen is on a treat as needed basis with the intent to treat if necessary and extend interval of exams when possible. On average 1 out of 6 patients do not need lifetime therapy. However, the risk of recurrent disease is high for a lifetime.  Initially monthly, then periodic, examinations and evaluations will determine whether the next treatment is required on the day of the examination.The nature of wet macular degeneration was discussed with the patient.  Forms of therapy reviewed include the use of Anti-VEGF medications injected painlessly into the eye, as well as other possible treatment modalities, including thermal laser therapy. Fellow eye involvement and risks were discussed with the patient. Upon  the finding of wet age related macular degeneration, treatment will be offered. The treatment regimen is on a treat as needed basis with the intent to treat if necessary and extend interval of exams when possible. On average 1 out of 6 patients do not need lifetime therapy. However, the risk of recurrent disease is high for a lifetime.  Initially monthly, then periodic, examinations and evaluations will determine whether the next treatment is required on the day of the examination.

## 2022-04-19 NOTE — Assessment & Plan Note (Signed)
Minor no impact on acuity 

## 2022-07-26 ENCOUNTER — Encounter (HOSPITAL_BASED_OUTPATIENT_CLINIC_OR_DEPARTMENT_OTHER): Payer: Self-pay | Admitting: Family Medicine

## 2022-08-09 ENCOUNTER — Other Ambulatory Visit (HOSPITAL_BASED_OUTPATIENT_CLINIC_OR_DEPARTMENT_OTHER): Payer: Self-pay | Admitting: Family Medicine

## 2022-08-09 DIAGNOSIS — M8580 Other specified disorders of bone density and structure, unspecified site: Secondary | ICD-10-CM

## 2022-08-22 ENCOUNTER — Encounter (INDEPENDENT_AMBULATORY_CARE_PROVIDER_SITE_OTHER): Payer: Medicare HMO | Admitting: Ophthalmology

## 2022-08-25 DIAGNOSIS — T7840XA Allergy, unspecified, initial encounter: Secondary | ICD-10-CM | POA: Diagnosis not present

## 2022-08-25 DIAGNOSIS — I10 Essential (primary) hypertension: Secondary | ICD-10-CM | POA: Diagnosis not present

## 2022-08-30 DIAGNOSIS — H43391 Other vitreous opacities, right eye: Secondary | ICD-10-CM | POA: Diagnosis not present

## 2022-08-30 DIAGNOSIS — H2513 Age-related nuclear cataract, bilateral: Secondary | ICD-10-CM | POA: Diagnosis not present

## 2022-08-30 DIAGNOSIS — H353124 Nonexudative age-related macular degeneration, left eye, advanced atrophic with subfoveal involvement: Secondary | ICD-10-CM | POA: Diagnosis not present

## 2022-08-30 DIAGNOSIS — H353112 Nonexudative age-related macular degeneration, right eye, intermediate dry stage: Secondary | ICD-10-CM | POA: Diagnosis not present

## 2022-08-30 DIAGNOSIS — H353221 Exudative age-related macular degeneration, left eye, with active choroidal neovascularization: Secondary | ICD-10-CM | POA: Diagnosis not present

## 2022-09-13 ENCOUNTER — Ambulatory Visit (HOSPITAL_BASED_OUTPATIENT_CLINIC_OR_DEPARTMENT_OTHER)
Admission: RE | Admit: 2022-09-13 | Discharge: 2022-09-13 | Disposition: A | Discharge status: Home / Self Care | Payer: Medicare HMO | Source: Ambulatory Visit | Attending: Family Medicine | Admitting: Family Medicine

## 2022-09-13 DIAGNOSIS — Z780 Asymptomatic menopausal state: Secondary | ICD-10-CM | POA: Insufficient documentation

## 2022-09-13 DIAGNOSIS — M8580 Other specified disorders of bone density and structure, unspecified site: Secondary | ICD-10-CM

## 2022-09-13 DIAGNOSIS — R7303 Prediabetes: Secondary | ICD-10-CM | POA: Diagnosis not present

## 2022-09-13 DIAGNOSIS — Z13820 Encounter for screening for osteoporosis: Secondary | ICD-10-CM | POA: Diagnosis not present

## 2022-09-13 DIAGNOSIS — M8589 Other specified disorders of bone density and structure, multiple sites: Secondary | ICD-10-CM | POA: Insufficient documentation

## 2022-09-13 DIAGNOSIS — Z79890 Hormone replacement therapy (postmenopausal): Secondary | ICD-10-CM | POA: Insufficient documentation

## 2022-09-14 DIAGNOSIS — L309 Dermatitis, unspecified: Secondary | ICD-10-CM | POA: Diagnosis not present

## 2022-09-14 DIAGNOSIS — Z6824 Body mass index (BMI) 24.0-24.9, adult: Secondary | ICD-10-CM | POA: Diagnosis not present

## 2022-10-04 DIAGNOSIS — E782 Mixed hyperlipidemia: Secondary | ICD-10-CM | POA: Diagnosis not present

## 2022-10-04 DIAGNOSIS — E559 Vitamin D deficiency, unspecified: Secondary | ICD-10-CM | POA: Diagnosis not present

## 2022-10-04 DIAGNOSIS — I10 Essential (primary) hypertension: Secondary | ICD-10-CM | POA: Diagnosis not present

## 2022-10-04 DIAGNOSIS — E1169 Type 2 diabetes mellitus with other specified complication: Secondary | ICD-10-CM | POA: Diagnosis not present

## 2022-10-11 DIAGNOSIS — E559 Vitamin D deficiency, unspecified: Secondary | ICD-10-CM | POA: Diagnosis not present

## 2022-10-11 DIAGNOSIS — E1169 Type 2 diabetes mellitus with other specified complication: Secondary | ICD-10-CM | POA: Diagnosis not present

## 2022-10-11 DIAGNOSIS — H353221 Exudative age-related macular degeneration, left eye, with active choroidal neovascularization: Secondary | ICD-10-CM | POA: Diagnosis not present

## 2022-10-11 DIAGNOSIS — Z0000 Encounter for general adult medical examination without abnormal findings: Secondary | ICD-10-CM | POA: Diagnosis not present

## 2022-10-11 DIAGNOSIS — I10 Essential (primary) hypertension: Secondary | ICD-10-CM | POA: Diagnosis not present

## 2022-10-11 DIAGNOSIS — K1370 Unspecified lesions of oral mucosa: Secondary | ICD-10-CM | POA: Diagnosis not present

## 2022-10-11 DIAGNOSIS — E1139 Type 2 diabetes mellitus with other diabetic ophthalmic complication: Secondary | ICD-10-CM | POA: Diagnosis not present

## 2022-10-11 DIAGNOSIS — E782 Mixed hyperlipidemia: Secondary | ICD-10-CM | POA: Diagnosis not present

## 2022-10-11 DIAGNOSIS — M8588 Other specified disorders of bone density and structure, other site: Secondary | ICD-10-CM | POA: Diagnosis not present

## 2022-10-11 DIAGNOSIS — Z23 Encounter for immunization: Secondary | ICD-10-CM | POA: Diagnosis not present

## 2022-10-12 DIAGNOSIS — H353112 Nonexudative age-related macular degeneration, right eye, intermediate dry stage: Secondary | ICD-10-CM | POA: Diagnosis not present

## 2022-10-12 DIAGNOSIS — H43391 Other vitreous opacities, right eye: Secondary | ICD-10-CM | POA: Diagnosis not present

## 2022-10-12 DIAGNOSIS — H2513 Age-related nuclear cataract, bilateral: Secondary | ICD-10-CM | POA: Diagnosis not present

## 2022-10-12 DIAGNOSIS — H353221 Exudative age-related macular degeneration, left eye, with active choroidal neovascularization: Secondary | ICD-10-CM | POA: Diagnosis not present

## 2022-12-13 DIAGNOSIS — H353112 Nonexudative age-related macular degeneration, right eye, intermediate dry stage: Secondary | ICD-10-CM | POA: Diagnosis not present

## 2022-12-13 DIAGNOSIS — H43391 Other vitreous opacities, right eye: Secondary | ICD-10-CM | POA: Diagnosis not present

## 2022-12-13 DIAGNOSIS — H2513 Age-related nuclear cataract, bilateral: Secondary | ICD-10-CM | POA: Diagnosis not present

## 2022-12-13 DIAGNOSIS — H353124 Nonexudative age-related macular degeneration, left eye, advanced atrophic with subfoveal involvement: Secondary | ICD-10-CM | POA: Diagnosis not present

## 2022-12-13 DIAGNOSIS — H353221 Exudative age-related macular degeneration, left eye, with active choroidal neovascularization: Secondary | ICD-10-CM | POA: Diagnosis not present

## 2023-02-15 DIAGNOSIS — H353221 Exudative age-related macular degeneration, left eye, with active choroidal neovascularization: Secondary | ICD-10-CM | POA: Diagnosis not present

## 2023-02-15 DIAGNOSIS — H353112 Nonexudative age-related macular degeneration, right eye, intermediate dry stage: Secondary | ICD-10-CM | POA: Diagnosis not present

## 2023-02-15 DIAGNOSIS — H43391 Other vitreous opacities, right eye: Secondary | ICD-10-CM | POA: Diagnosis not present

## 2023-02-15 DIAGNOSIS — H353124 Nonexudative age-related macular degeneration, left eye, advanced atrophic with subfoveal involvement: Secondary | ICD-10-CM | POA: Diagnosis not present

## 2023-02-15 DIAGNOSIS — H2513 Age-related nuclear cataract, bilateral: Secondary | ICD-10-CM | POA: Diagnosis not present

## 2023-03-13 DIAGNOSIS — H353124 Nonexudative age-related macular degeneration, left eye, advanced atrophic with subfoveal involvement: Secondary | ICD-10-CM | POA: Diagnosis not present

## 2023-03-13 DIAGNOSIS — H2513 Age-related nuclear cataract, bilateral: Secondary | ICD-10-CM | POA: Diagnosis not present

## 2023-03-13 DIAGNOSIS — H353221 Exudative age-related macular degeneration, left eye, with active choroidal neovascularization: Secondary | ICD-10-CM | POA: Diagnosis not present

## 2023-03-13 DIAGNOSIS — H353112 Nonexudative age-related macular degeneration, right eye, intermediate dry stage: Secondary | ICD-10-CM | POA: Diagnosis not present

## 2023-03-13 DIAGNOSIS — H43391 Other vitreous opacities, right eye: Secondary | ICD-10-CM | POA: Diagnosis not present

## 2023-04-26 DIAGNOSIS — H2513 Age-related nuclear cataract, bilateral: Secondary | ICD-10-CM | POA: Diagnosis not present

## 2023-04-26 DIAGNOSIS — H353112 Nonexudative age-related macular degeneration, right eye, intermediate dry stage: Secondary | ICD-10-CM | POA: Diagnosis not present

## 2023-04-26 DIAGNOSIS — H353124 Nonexudative age-related macular degeneration, left eye, advanced atrophic with subfoveal involvement: Secondary | ICD-10-CM | POA: Diagnosis not present

## 2023-04-26 DIAGNOSIS — H43391 Other vitreous opacities, right eye: Secondary | ICD-10-CM | POA: Diagnosis not present

## 2023-04-26 DIAGNOSIS — H353221 Exudative age-related macular degeneration, left eye, with active choroidal neovascularization: Secondary | ICD-10-CM | POA: Diagnosis not present

## 2023-05-16 DIAGNOSIS — H43391 Other vitreous opacities, right eye: Secondary | ICD-10-CM | POA: Diagnosis not present

## 2023-05-16 DIAGNOSIS — H353221 Exudative age-related macular degeneration, left eye, with active choroidal neovascularization: Secondary | ICD-10-CM | POA: Diagnosis not present

## 2023-05-16 DIAGNOSIS — H353112 Nonexudative age-related macular degeneration, right eye, intermediate dry stage: Secondary | ICD-10-CM | POA: Diagnosis not present

## 2023-05-16 DIAGNOSIS — H2513 Age-related nuclear cataract, bilateral: Secondary | ICD-10-CM | POA: Diagnosis not present

## 2023-05-16 DIAGNOSIS — H353124 Nonexudative age-related macular degeneration, left eye, advanced atrophic with subfoveal involvement: Secondary | ICD-10-CM | POA: Diagnosis not present

## 2023-06-19 DIAGNOSIS — H353112 Nonexudative age-related macular degeneration, right eye, intermediate dry stage: Secondary | ICD-10-CM | POA: Diagnosis not present

## 2023-06-19 DIAGNOSIS — H353221 Exudative age-related macular degeneration, left eye, with active choroidal neovascularization: Secondary | ICD-10-CM | POA: Diagnosis not present

## 2023-06-19 DIAGNOSIS — H43391 Other vitreous opacities, right eye: Secondary | ICD-10-CM | POA: Diagnosis not present

## 2023-06-19 DIAGNOSIS — H2513 Age-related nuclear cataract, bilateral: Secondary | ICD-10-CM | POA: Diagnosis not present

## 2023-06-19 DIAGNOSIS — H353124 Nonexudative age-related macular degeneration, left eye, advanced atrophic with subfoveal involvement: Secondary | ICD-10-CM | POA: Diagnosis not present

## 2023-06-27 DIAGNOSIS — H2513 Age-related nuclear cataract, bilateral: Secondary | ICD-10-CM | POA: Diagnosis not present

## 2023-06-27 DIAGNOSIS — H353124 Nonexudative age-related macular degeneration, left eye, advanced atrophic with subfoveal involvement: Secondary | ICD-10-CM | POA: Diagnosis not present

## 2023-06-27 DIAGNOSIS — H43391 Other vitreous opacities, right eye: Secondary | ICD-10-CM | POA: Diagnosis not present

## 2023-06-27 DIAGNOSIS — H353221 Exudative age-related macular degeneration, left eye, with active choroidal neovascularization: Secondary | ICD-10-CM | POA: Diagnosis not present

## 2023-06-27 DIAGNOSIS — H353112 Nonexudative age-related macular degeneration, right eye, intermediate dry stage: Secondary | ICD-10-CM | POA: Diagnosis not present

## 2023-07-24 DIAGNOSIS — H353112 Nonexudative age-related macular degeneration, right eye, intermediate dry stage: Secondary | ICD-10-CM | POA: Diagnosis not present

## 2023-07-24 DIAGNOSIS — H353221 Exudative age-related macular degeneration, left eye, with active choroidal neovascularization: Secondary | ICD-10-CM | POA: Diagnosis not present

## 2023-07-24 DIAGNOSIS — H353124 Nonexudative age-related macular degeneration, left eye, advanced atrophic with subfoveal involvement: Secondary | ICD-10-CM | POA: Diagnosis not present

## 2023-07-24 DIAGNOSIS — H43391 Other vitreous opacities, right eye: Secondary | ICD-10-CM | POA: Diagnosis not present

## 2023-07-24 DIAGNOSIS — H2513 Age-related nuclear cataract, bilateral: Secondary | ICD-10-CM | POA: Diagnosis not present

## 2023-08-08 DIAGNOSIS — H2513 Age-related nuclear cataract, bilateral: Secondary | ICD-10-CM | POA: Diagnosis not present

## 2023-08-08 DIAGNOSIS — H43391 Other vitreous opacities, right eye: Secondary | ICD-10-CM | POA: Diagnosis not present

## 2023-08-08 DIAGNOSIS — H353112 Nonexudative age-related macular degeneration, right eye, intermediate dry stage: Secondary | ICD-10-CM | POA: Diagnosis not present

## 2023-08-08 DIAGNOSIS — H353124 Nonexudative age-related macular degeneration, left eye, advanced atrophic with subfoveal involvement: Secondary | ICD-10-CM | POA: Diagnosis not present

## 2023-08-08 DIAGNOSIS — H353221 Exudative age-related macular degeneration, left eye, with active choroidal neovascularization: Secondary | ICD-10-CM | POA: Diagnosis not present

## 2023-10-02 DIAGNOSIS — H353221 Exudative age-related macular degeneration, left eye, with active choroidal neovascularization: Secondary | ICD-10-CM | POA: Diagnosis not present

## 2023-10-02 DIAGNOSIS — H353124 Nonexudative age-related macular degeneration, left eye, advanced atrophic with subfoveal involvement: Secondary | ICD-10-CM | POA: Diagnosis not present

## 2023-10-02 DIAGNOSIS — H353112 Nonexudative age-related macular degeneration, right eye, intermediate dry stage: Secondary | ICD-10-CM | POA: Diagnosis not present

## 2023-10-02 DIAGNOSIS — H43391 Other vitreous opacities, right eye: Secondary | ICD-10-CM | POA: Diagnosis not present

## 2023-10-02 DIAGNOSIS — H2513 Age-related nuclear cataract, bilateral: Secondary | ICD-10-CM | POA: Diagnosis not present

## 2023-10-11 DIAGNOSIS — E559 Vitamin D deficiency, unspecified: Secondary | ICD-10-CM | POA: Diagnosis not present

## 2023-10-11 DIAGNOSIS — E782 Mixed hyperlipidemia: Secondary | ICD-10-CM | POA: Diagnosis not present

## 2023-10-11 DIAGNOSIS — E1169 Type 2 diabetes mellitus with other specified complication: Secondary | ICD-10-CM | POA: Diagnosis not present

## 2023-10-11 DIAGNOSIS — I10 Essential (primary) hypertension: Secondary | ICD-10-CM | POA: Diagnosis not present

## 2023-10-20 DIAGNOSIS — Z0000 Encounter for general adult medical examination without abnormal findings: Secondary | ICD-10-CM | POA: Diagnosis not present

## 2023-10-20 DIAGNOSIS — Z6825 Body mass index (BMI) 25.0-25.9, adult: Secondary | ICD-10-CM | POA: Diagnosis not present

## 2023-10-20 DIAGNOSIS — Z23 Encounter for immunization: Secondary | ICD-10-CM | POA: Diagnosis not present

## 2024-01-02 DIAGNOSIS — H353124 Nonexudative age-related macular degeneration, left eye, advanced atrophic with subfoveal involvement: Secondary | ICD-10-CM | POA: Diagnosis not present

## 2024-01-02 DIAGNOSIS — H02833 Dermatochalasis of right eye, unspecified eyelid: Secondary | ICD-10-CM | POA: Diagnosis not present

## 2024-01-02 DIAGNOSIS — H353112 Nonexudative age-related macular degeneration, right eye, intermediate dry stage: Secondary | ICD-10-CM | POA: Diagnosis not present

## 2024-01-02 DIAGNOSIS — H353221 Exudative age-related macular degeneration, left eye, with active choroidal neovascularization: Secondary | ICD-10-CM | POA: Diagnosis not present

## 2024-01-02 DIAGNOSIS — H2513 Age-related nuclear cataract, bilateral: Secondary | ICD-10-CM | POA: Diagnosis not present

## 2024-01-02 DIAGNOSIS — H02836 Dermatochalasis of left eye, unspecified eyelid: Secondary | ICD-10-CM | POA: Diagnosis not present

## 2024-01-02 DIAGNOSIS — H35372 Puckering of macula, left eye: Secondary | ICD-10-CM | POA: Diagnosis not present

## 2024-01-02 DIAGNOSIS — H43391 Other vitreous opacities, right eye: Secondary | ICD-10-CM | POA: Diagnosis not present

## 2024-04-01 DIAGNOSIS — H43391 Other vitreous opacities, right eye: Secondary | ICD-10-CM | POA: Diagnosis not present

## 2024-04-01 DIAGNOSIS — H353112 Nonexudative age-related macular degeneration, right eye, intermediate dry stage: Secondary | ICD-10-CM | POA: Diagnosis not present

## 2024-04-01 DIAGNOSIS — H2513 Age-related nuclear cataract, bilateral: Secondary | ICD-10-CM | POA: Diagnosis not present

## 2024-04-01 DIAGNOSIS — H353124 Nonexudative age-related macular degeneration, left eye, advanced atrophic with subfoveal involvement: Secondary | ICD-10-CM | POA: Diagnosis not present

## 2024-04-01 DIAGNOSIS — H353221 Exudative age-related macular degeneration, left eye, with active choroidal neovascularization: Secondary | ICD-10-CM | POA: Diagnosis not present

## 2024-04-01 DIAGNOSIS — H35372 Puckering of macula, left eye: Secondary | ICD-10-CM | POA: Diagnosis not present

## 2024-08-15 ENCOUNTER — Telehealth: Payer: Self-pay | Admitting: Family Medicine

## 2024-08-15 NOTE — Telephone Encounter (Signed)
 Copied from CRM [id number]. Topic: Appointments - Scheduling Inquiry for Clinic >> Aug 15, 2024  4:19 PM China J wrote: Reason for CRM: The patient was referred by her friend Sherrilyn Cramp to establish with Dr. Frann. She would like to know if he would be able to make the exception to see her as a new patient.  Please call the patient at [phone] for updates.

## 2024-08-16 NOTE — Telephone Encounter (Signed)
 OK

## 2024-08-19 NOTE — Telephone Encounter (Signed)
 Pt scheduled

## 2024-10-02 ENCOUNTER — Ambulatory Visit: Admitting: Family Medicine
